# Patient Record
Sex: Female | Born: 1970 | Race: White | Hispanic: No | Smoking: Current every day smoker
Health system: Southern US, Community
[De-identification: ages and names within clinical notes are randomized; demographics above are authoritative.]

## PROBLEM LIST (undated history)

## (undated) DIAGNOSIS — F32A Depression, unspecified: Secondary | ICD-10-CM

## (undated) DIAGNOSIS — E079 Disorder of thyroid, unspecified: Secondary | ICD-10-CM

## (undated) DIAGNOSIS — J449 Chronic obstructive pulmonary disease, unspecified: Secondary | ICD-10-CM

## (undated) DIAGNOSIS — N028 Recurrent and persistent hematuria with other morphologic changes: Secondary | ICD-10-CM

## (undated) DIAGNOSIS — K769 Liver disease, unspecified: Secondary | ICD-10-CM

## (undated) DIAGNOSIS — N02B9 Other recurrent and persistent immunoglobulin A nephropathy: Secondary | ICD-10-CM

## (undated) DIAGNOSIS — N611 Abscess of the breast and nipple: Secondary | ICD-10-CM

## (undated) DIAGNOSIS — F319 Bipolar disorder, unspecified: Secondary | ICD-10-CM

## (undated) DIAGNOSIS — K219 Gastro-esophageal reflux disease without esophagitis: Secondary | ICD-10-CM

## (undated) DIAGNOSIS — E875 Hyperkalemia: Secondary | ICD-10-CM

## (undated) DIAGNOSIS — F419 Anxiety disorder, unspecified: Secondary | ICD-10-CM

## (undated) DIAGNOSIS — F1910 Other psychoactive substance abuse, uncomplicated: Secondary | ICD-10-CM

## (undated) HISTORY — DX: Bipolar disorder, unspecified: F31.9

## (undated) HISTORY — DX: Disorder of thyroid, unspecified: E07.9

## (undated) HISTORY — DX: Hyperkalemia: E87.5

## (undated) HISTORY — PX: BREAST EXCISIONAL BIOPSY: SUR124

## (undated) HISTORY — DX: Abscess of the breast and nipple: N61.1

## (undated) HISTORY — DX: Chronic obstructive pulmonary disease, unspecified: J44.9

## (undated) HISTORY — DX: Liver disease, unspecified: K76.9

## (undated) HISTORY — DX: Other psychoactive substance abuse, uncomplicated: F19.10

## (undated) HISTORY — DX: Gastro-esophageal reflux disease without esophagitis: K21.9

---
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
## 1998-01-30 ENCOUNTER — Other Ambulatory Visit: Admission: RE | Admit: 1998-01-30 | Discharge: 1998-01-30 | Payer: Self-pay | Admitting: Gynecology

## 1999-02-19 ENCOUNTER — Other Ambulatory Visit: Admission: RE | Admit: 1999-02-19 | Discharge: 1999-02-19 | Payer: Self-pay | Admitting: Gynecology

## 1999-04-01 ENCOUNTER — Other Ambulatory Visit: Admission: RE | Admit: 1999-04-01 | Discharge: 1999-04-01 | Payer: Self-pay | Admitting: Gynecology

## 1999-11-10 ENCOUNTER — Other Ambulatory Visit: Admission: RE | Admit: 1999-11-10 | Discharge: 1999-11-10 | Payer: Self-pay | Admitting: Gynecology

## 1999-12-13 ENCOUNTER — Emergency Department (HOSPITAL_COMMUNITY): Admission: EM | Admit: 1999-12-13 | Discharge: 1999-12-13 | Payer: Self-pay

## 2000-08-12 ENCOUNTER — Other Ambulatory Visit: Admission: RE | Admit: 2000-08-12 | Discharge: 2000-08-12 | Payer: Self-pay | Admitting: *Deleted

## 2000-12-08 ENCOUNTER — Encounter: Admission: RE | Admit: 2000-12-08 | Discharge: 2000-12-08 | Payer: Self-pay | Admitting: Family Medicine

## 2000-12-08 ENCOUNTER — Encounter: Payer: Self-pay | Admitting: Family Medicine

## 2000-12-14 ENCOUNTER — Encounter: Payer: Self-pay | Admitting: Family Medicine

## 2000-12-14 ENCOUNTER — Encounter: Admission: RE | Admit: 2000-12-14 | Discharge: 2000-12-14 | Payer: Self-pay | Admitting: Family Medicine

## 2001-02-15 ENCOUNTER — Emergency Department (HOSPITAL_COMMUNITY): Admission: EM | Admit: 2001-02-15 | Discharge: 2001-02-15 | Payer: Self-pay | Admitting: *Deleted

## 2001-06-22 ENCOUNTER — Encounter: Admission: RE | Admit: 2001-06-22 | Discharge: 2001-06-22 | Payer: Self-pay | Admitting: Internal Medicine

## 2001-06-22 ENCOUNTER — Encounter: Payer: Self-pay | Admitting: Internal Medicine

## 2002-05-29 ENCOUNTER — Encounter: Payer: Self-pay | Admitting: Internal Medicine

## 2002-05-29 ENCOUNTER — Encounter: Admission: RE | Admit: 2002-05-29 | Discharge: 2002-05-29 | Payer: Self-pay | Admitting: Internal Medicine

## 2002-06-04 ENCOUNTER — Encounter: Admission: RE | Admit: 2002-06-04 | Discharge: 2002-06-04 | Payer: Self-pay | Admitting: Internal Medicine

## 2002-06-04 ENCOUNTER — Encounter: Payer: Self-pay | Admitting: Internal Medicine

## 2002-10-11 ENCOUNTER — Emergency Department (HOSPITAL_COMMUNITY): Admission: EM | Admit: 2002-10-11 | Discharge: 2002-10-11 | Payer: Self-pay | Admitting: Emergency Medicine

## 2002-10-23 ENCOUNTER — Inpatient Hospital Stay (HOSPITAL_COMMUNITY): Admission: EM | Admit: 2002-10-23 | Discharge: 2002-10-24 | Payer: Self-pay | Admitting: Emergency Medicine

## 2004-07-07 ENCOUNTER — Other Ambulatory Visit: Admission: RE | Admit: 2004-07-07 | Discharge: 2004-07-07 | Payer: Self-pay | Admitting: Internal Medicine

## 2005-05-21 ENCOUNTER — Encounter: Admission: RE | Admit: 2005-05-21 | Discharge: 2005-05-21 | Payer: Self-pay | Admitting: Internal Medicine

## 2005-08-25 ENCOUNTER — Other Ambulatory Visit: Admission: RE | Admit: 2005-08-25 | Discharge: 2005-08-25 | Payer: Self-pay | Admitting: Internal Medicine

## 2005-09-06 ENCOUNTER — Encounter: Admission: RE | Admit: 2005-09-06 | Discharge: 2005-09-06 | Payer: Self-pay | Admitting: Internal Medicine

## 2006-05-08 ENCOUNTER — Emergency Department (HOSPITAL_COMMUNITY): Admission: EM | Admit: 2006-05-08 | Discharge: 2006-05-08 | Payer: Self-pay | Admitting: Emergency Medicine

## 2006-08-22 ENCOUNTER — Other Ambulatory Visit: Admission: RE | Admit: 2006-08-22 | Discharge: 2006-08-22 | Payer: Self-pay | Admitting: Family Medicine

## 2007-09-19 ENCOUNTER — Other Ambulatory Visit: Admission: RE | Admit: 2007-09-19 | Discharge: 2007-09-19 | Payer: Self-pay | Admitting: Family Medicine

## 2008-10-23 ENCOUNTER — Other Ambulatory Visit: Admission: RE | Admit: 2008-10-23 | Discharge: 2008-10-23 | Payer: Self-pay | Admitting: Family Medicine

## 2009-09-17 ENCOUNTER — Encounter: Admission: RE | Admit: 2009-09-17 | Discharge: 2009-09-17 | Payer: Self-pay | Admitting: Family Medicine

## 2010-06-15 ENCOUNTER — Encounter: Admission: RE | Admit: 2010-06-15 | Discharge: 2010-06-15 | Payer: Self-pay | Admitting: Family Medicine

## 2011-03-26 NOTE — Discharge Summary (Signed)
NAME:  Heidi Williamson, Heidi Williamson                       ACCOUNT NO.:  [id number]   MEDICAL RECORD NO.:  [id number]                   PATIENT TYPE:  INP   LOCATION:  0373                                 FACILITY:  [facility]   PHYSICIAN:  Sherin Quarry, MD                   DATE OF BIRTH:  06-09-1971   DATE OF ADMISSION:  10/22/2002  DATE OF DISCHARGE:  10/24/2002                                 DISCHARGE SUMMARY   HISTORY OF PRESENT ILLNESS:  The patient is a 40 year old lady with a  history of depression who presented to the [facility] emergency room on  October 22, 2002 reporting that she had taken a large number of Seroquel  tablets.  She stated that she did this in order to attempt to commit  suicide.  The patient's history was of uncertain accuracy because subsequent  to her admission to the hospital she showed no signs of any sedation or  lethargy, which would have been anticipated from taking a large number of  Seroquel tablets.  The patient was admitted to the hospital per Dr. Soyla Dryer.   PHYSICAL EXAMINATION AT THE TIME OF ADMISSION:  HEENT:  Within normal  limits.  CHEST:  Remarkable for rhonchi at the right base with mild expiratory  wheezing consistent with a history of heavy cigarette smoking.  CARDIOVASCULAR:  Revealed normal S1 and S2 without rubs, murmurs, or  gallops.  ABDOMEN:  Benign.  EXTREMITIES:  Examination revealed no evidence of cyanosis or edema.   LABORATORY DATA:  An electrocardiogram was obtained which showed normal  sinus rhythm without evidence of ischemia or hypertrophy.   Laboratory studies obtained showed sodium 138, potassium 3.5, glucose 94.  Renal function and liver studies were normal.  CBC was within normal limits.  Tricyclic screen was positive.  Drug screen was positive for barbiturates,  benzodiazepines, THC.  Urine pregnancy test was negative.  Blood alcohol  level was negative.  Salicylate level was negative.   HOSPITAL COURSE:  On admission the  patient was placed under suicide  precautions.  She was maintained on her usual medications which she reported  were Lexapro 10 mg b.i.d. and Seroquel 25 mg t.i.d.  I am not certain  whether this information is accurate.  In light of the finding of some chest  congestion, the patient was also placed on Tequin 400 mg daily for presumed  bronchitis.  ACT team consult was obtained and the patient will be  transferred to a state psychiatric facility for further treatment.  Therefore, on October 24, 2002 the patient was discharged.   DISCHARGE DIAGNOSES:  1. Possible Seroquel overdose with no apparent residual effects.  2. Chronic bronchitis secondary to cigarette smoking.  3.     Chronic depression.  4. 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   DISPOSITION:  Subsequent treatment will be per the psychiatrist at the state  facility.  Sherin Quarry, MD    SY/MEDQ  D:  10/24/2002  T:  10/24/2002  Job:  [id number]   cc:   Milagros Evener, M.D.  [address] [id number]  [place], [place] [address]  Fax: [phone]

## 2011-09-27 ENCOUNTER — Encounter (INDEPENDENT_AMBULATORY_CARE_PROVIDER_SITE_OTHER): Payer: Self-pay | Admitting: Surgery

## 2011-10-04 ENCOUNTER — Encounter (HOSPITAL_COMMUNITY): Payer: Self-pay | Admitting: Pharmacy Technician

## 2011-10-04 ENCOUNTER — Ambulatory Visit (INDEPENDENT_AMBULATORY_CARE_PROVIDER_SITE_OTHER): Payer: Self-pay | Admitting: Surgery

## 2011-10-04 ENCOUNTER — Encounter (INDEPENDENT_AMBULATORY_CARE_PROVIDER_SITE_OTHER): Payer: Self-pay | Admitting: Surgery

## 2011-10-04 VITALS — BP 100/60 | HR 77 | Temp 96.8°F | Resp 16 | Ht 66.0 in | Wt 160.2 lb

## 2011-10-04 DIAGNOSIS — N61 Mastitis without abscess: Secondary | ICD-10-CM

## 2011-10-04 DIAGNOSIS — N611 Abscess of the breast and nipple: Secondary | ICD-10-CM | POA: Insufficient documentation

## 2011-10-04 NOTE — Progress Notes (Signed)
Patient ID: Heidi Williamson, female   DOB: 01/26/1971, 40 y.o.   MRN: [id number]  Chief Complaint  Patient presents with  . Breast Pain    recurrent abscess    HPI Heidi Williamson is a 40 y.o. female.   HPI This is a pleasant female referred by Dr. Dr. Robert Fried for evaluation of a chronic left breast abscess. She has had intermittent abscess in her left breast for approximately 2 years. It has never had to have an incision and drainage performed. It always improves with antibiotics. She has had no spontaneous nipple discharge. She has had no previous breast surgery. She is otherwise without complaints Past Medical History  Diagnosis Date  . GERD (gastroesophageal reflux disease)   . Hyperpotassemia   . Bipolar 1 disorder   . Breast abscess     Past Surgical History  Procedure Date  . Wisdom tooth extraction 1988    History reviewed. No pertinent family history.  Social History History  Substance Use Topics  . Smoking status: Current Everyday Smoker -- 1.5 packs/day    Types: Cigarettes  . Smokeless tobacco: Not on file  . Alcohol Use: Not on file    No Known Allergies  Current Outpatient Prescriptions  Medication Sig Dispense Refill  . citalopram (CELEXA) 20 MG tablet Take 20 mg by mouth daily.        . Multiple Vitamin (MULTIVITAMIN) capsule Take 1 capsule by mouth daily.        . topiramate (TOPAMAX) 50 MG tablet Take 50 mg by mouth 2 (two) times daily.          Review of Systems Review of Systems  Constitutional: Negative for fever, chills and unexpected weight change.  HENT: Negative for hearing loss, congestion, sore throat, trouble swallowing and voice change.   Eyes: Negative for visual disturbance.  Respiratory: Negative for cough and wheezing.   Cardiovascular: Negative for chest pain, palpitations and leg swelling.  Gastrointestinal: Negative for nausea, vomiting, abdominal pain, diarrhea, constipation, blood in stool, abdominal distention and anal  bleeding.  Genitourinary: Negative for hematuria, vaginal bleeding and difficulty urinating.  Musculoskeletal: Negative for arthralgias.  Skin: Negative for rash and wound.  Neurological: Negative for seizures, syncope and headaches.  Hematological: Negative for adenopathy. Does not bruise/bleed easily.  Psychiatric/Behavioral: Negative for confusion.    Blood pressure 100/60, pulse 77, temperature 96.8 F (36 C), temperature source Temporal, resp. rate 16, height 5' 6" (1.676 m), weight 160 lb 3.2 oz (72.666 kg).  Physical Exam Physical Exam  Constitutional: She is oriented to person, place, and time. She appears well-developed and well-nourished. No distress.  HENT:  Head: Normocephalic.  Right Ear: External ear normal.  Left Ear: External ear normal.  Nose: Nose normal.  Mouth/Throat: Oropharynx is clear and moist. No oropharyngeal exudate.  Eyes: Conjunctivae are normal. Pupils are equal, round, and reactive to light. No scleral icterus.  Neck: Normal range of motion. Neck supple. No tracheal deviation present. No thyromegaly present.  Cardiovascular: Normal rate, regular rhythm, normal heart sounds and intact distal pulses.   No murmur heard. Pulmonary/Chest: Effort normal and breath sounds normal. No respiratory distress. She has no wheezes. She has no rales.  Musculoskeletal: Normal range of motion. She exhibits no edema.  Lymphadenopathy:    She has no cervical adenopathy.  Neurological: She is alert and oriented to person, place, and time. No cranial nerve deficit. Coordination normal.  Skin: Skin is warm and dry. No erythema.  Psychiatric: Her behavior   is normal. Judgment normal.  On breast exam, she has a slightly raised area at the 9:00 position of the left breast adjacent to the areola. There is very mild erythema. This area is nontender today. This appears consistent with a chronic abscess  Data Reviewed I have reviewed the notes from her primary care physician's  office. I have her mammogram which is unremarkable. There is no evidence of malignancy. An ultrasound was also unremarkable.  Assessment    Patient with a chronic left breast abscess    Plan    I discussed this with the patient in detail. Surgical excision of this area is recommended to hopefully resolve the abscess as well as rule out malignancy. I discussed the risk of surgery which includes but is not limited to bleeding and infection. I also discussed her smoking with her and encouraged her to quit. Surgery will be scheduled. Likelihood of success is good         Arye Weyenberg A 10/04/2011, 9:44 AM    

## 2011-10-05 ENCOUNTER — Encounter (HOSPITAL_COMMUNITY): Payer: Self-pay | Admitting: *Deleted

## 2011-10-05 DIAGNOSIS — K219 Gastro-esophageal reflux disease without esophagitis: Secondary | ICD-10-CM

## 2011-10-05 HISTORY — DX: Gastro-esophageal reflux disease without esophagitis: K21.9

## 2011-10-05 HISTORY — PX: CERVICAL CONE BIOPSY: SUR198

## 2011-10-06 ENCOUNTER — Encounter (HOSPITAL_COMMUNITY): Payer: Self-pay | Admitting: *Deleted

## 2011-10-06 ENCOUNTER — Ambulatory Visit (HOSPITAL_COMMUNITY)
Admission: RE | Admit: 2011-10-06 | Discharge: 2011-10-06 | Disposition: A | Discharge status: Home / Self Care | Payer: Self-pay | Source: Ambulatory Visit | Attending: Surgery | Admitting: Surgery

## 2011-10-06 ENCOUNTER — Encounter (HOSPITAL_COMMUNITY): Payer: Self-pay | Admitting: Anesthesiology

## 2011-10-06 ENCOUNTER — Ambulatory Visit (HOSPITAL_COMMUNITY): Payer: Self-pay | Admitting: Anesthesiology

## 2011-10-06 ENCOUNTER — Other Ambulatory Visit (INDEPENDENT_AMBULATORY_CARE_PROVIDER_SITE_OTHER): Payer: Self-pay | Admitting: Surgery

## 2011-10-06 ENCOUNTER — Encounter (HOSPITAL_COMMUNITY)
Admission: RE | Disposition: A | Discharge status: Home / Self Care | Payer: Self-pay | Source: Ambulatory Visit | Attending: Surgery

## 2011-10-06 DIAGNOSIS — K219 Gastro-esophageal reflux disease without esophagitis: Secondary | ICD-10-CM | POA: Insufficient documentation

## 2011-10-06 DIAGNOSIS — Z79899 Other long term (current) drug therapy: Secondary | ICD-10-CM | POA: Insufficient documentation

## 2011-10-06 DIAGNOSIS — N61 Mastitis without abscess: Secondary | ICD-10-CM | POA: Insufficient documentation

## 2011-10-06 HISTORY — PX: BREAST LUMPECTOMY: SHX2

## 2011-10-06 LAB — SURGICAL PCR SCREEN
MRSA, PCR: NEGATIVE
Staphylococcus aureus: NEGATIVE

## 2011-10-06 LAB — HCG, SERUM, QUALITATIVE: Preg, Serum: NEGATIVE

## 2011-10-06 SURGERY — BREAST LUMPECTOMY
Anesthesia: General | Site: Breast | Laterality: Left | Wound class: Contaminated

## 2011-10-06 MED ORDER — KETOROLAC TROMETHAMINE 30 MG/ML IJ SOLN
15.0000 mg | Freq: Once | INTRAMUSCULAR | Status: DC | PRN
Start: 2011-10-06 — End: 2011-10-06

## 2011-10-06 MED ORDER — BUPIVACAINE HCL (PF) 0.25 % IJ SOLN
INTRAMUSCULAR | Status: DC | PRN
  Administered 2011-10-06: 10 mL

## 2011-10-06 MED ORDER — SODIUM CHLORIDE 0.9 % IV SOLN: 250.0000 mL | INTRAVENOUS | Status: DC | PRN

## 2011-10-06 MED ORDER — BUPIVACAINE HCL (PF) 0.25 % IJ SOLN
INTRAMUSCULAR | Status: AC
  Filled 2011-10-06: qty 30

## 2011-10-06 MED ORDER — FENTANYL CITRATE 0.05 MG/ML IJ SOLN
50.0000 ug | INTRAMUSCULAR | Status: DC | PRN
  Administered 2011-10-06 (×2): 50 ug via INTRAVENOUS

## 2011-10-06 MED ORDER — PROPOFOL 10 MG/ML IV EMUL
INTRAVENOUS | Status: DC | PRN
  Administered 2011-10-06: 150 mL via INTRAVENOUS
  Administered 2011-10-06: 70 mL via INTRAVENOUS
  Administered 2011-10-06: 50 mL via INTRAVENOUS

## 2011-10-06 MED ORDER — ONDANSETRON HCL 4 MG/2ML IJ SOLN: 4.0000 mg | Freq: Four times a day (QID) | INTRAMUSCULAR | Status: DC | PRN

## 2011-10-06 MED ORDER — CEFAZOLIN SODIUM 1-5 GM-% IV SOLN
1.0000 g | INTRAVENOUS | Status: AC
  Administered 2011-10-06: 1 g via INTRAVENOUS

## 2011-10-06 MED ORDER — ACETAMINOPHEN 10 MG/ML IV SOLN
INTRAVENOUS | Status: DC | PRN
  Administered 2011-10-06: 1000 mg via INTRAVENOUS

## 2011-10-06 MED ORDER — MUPIROCIN 2 % EX OINT
TOPICAL_OINTMENT | CUTANEOUS | Status: AC
  Filled 2011-10-06: qty 22

## 2011-10-06 MED ORDER — ONDANSETRON HCL 4 MG/2ML IJ SOLN
INTRAMUSCULAR | Status: DC | PRN
  Administered 2011-10-06: 4 mg via INTRAVENOUS

## 2011-10-06 MED ORDER — FENTANYL CITRATE 0.05 MG/ML IJ SOLN
INTRAMUSCULAR | Status: AC
  Filled 2011-10-06: qty 2

## 2011-10-06 MED ORDER — FENTANYL CITRATE 0.05 MG/ML IJ SOLN
INTRAMUSCULAR | Status: DC | PRN
  Administered 2011-10-06: 50 ug via INTRAVENOUS
  Administered 2011-10-06 (×2): 100 ug via INTRAVENOUS

## 2011-10-06 MED ORDER — KETOROLAC TROMETHAMINE 60 MG/2ML IM SOLN
INTRAMUSCULAR | Status: DC | PRN
  Administered 2011-10-06: 30 mg via INTRAMUSCULAR

## 2011-10-06 MED ORDER — SODIUM CHLORIDE 0.9 % IJ SOLN: 3.0000 mL | Freq: Two times a day (BID) | INTRAMUSCULAR | Status: DC

## 2011-10-06 MED ORDER — ACETAMINOPHEN 325 MG PO TABS: 650.0000 mg | ORAL_TABLET | ORAL | Status: DC | PRN

## 2011-10-06 MED ORDER — CEPHALEXIN 500 MG PO CAPS: 500.0000 mg | ORAL_CAPSULE | Freq: Three times a day (TID) | ORAL | Status: AC

## 2011-10-06 MED ORDER — ACETAMINOPHEN 10 MG/ML IV SOLN
INTRAVENOUS | Status: AC
  Filled 2011-10-06: qty 100

## 2011-10-06 MED ORDER — MIDAZOLAM HCL 5 MG/5ML IJ SOLN
INTRAMUSCULAR | Status: DC | PRN
  Administered 2011-10-06: 2 mg via INTRAVENOUS

## 2011-10-06 MED ORDER — SODIUM CHLORIDE 0.9 % IJ SOLN: 3.0000 mL | INTRAMUSCULAR | Status: DC | PRN

## 2011-10-06 MED ORDER — BUPIVACAINE LIPOSOME 1.3 % IJ SUSP
20.0000 mL | Freq: Once | INTRAMUSCULAR | Status: AC
  Administered 2011-10-06: 17 mL
  Filled 2011-10-06: qty 20

## 2011-10-06 MED ORDER — CEFAZOLIN SODIUM 1-5 GM-% IV SOLN
INTRAVENOUS | Status: AC
  Filled 2011-10-06: qty 50

## 2011-10-06 MED ORDER — ACETAMINOPHEN 650 MG RE SUPP
650.0000 mg | RECTAL | Status: DC | PRN
  Filled 2011-10-06: qty 1

## 2011-10-06 MED ORDER — KETOROLAC TROMETHAMINE 10 MG PO TABS: 10.0000 mg | ORAL_TABLET | Freq: Four times a day (QID) | ORAL | Status: AC | PRN

## 2011-10-06 MED ORDER — PROMETHAZINE HCL 25 MG/ML IJ SOLN: 6.2500 mg | INTRAMUSCULAR | Status: DC | PRN

## 2011-10-06 MED ORDER — LACTATED RINGERS IV SOLN
INTRAVENOUS | Status: DC
  Administered 2011-10-06: 16:00:00 via INTRAVENOUS

## 2011-10-06 MED ORDER — ACETAMINOPHEN 10 MG/ML IV SOLN
1000.0000 mg | Freq: Once | INTRAVENOUS | Status: DC | PRN
  Filled 2011-10-06: qty 100

## 2011-10-06 MED ORDER — LACTATED RINGERS IV SOLN
INTRAVENOUS | Status: DC
  Administered 2011-10-06: 1000 mL via INTRAVENOUS

## 2011-10-06 SURGICAL SUPPLY — 30 items
APPLIER CLIP 9.375 MED OPEN (MISCELLANEOUS)
CANISTER SUCTION 2500CC (MISCELLANEOUS) ×2 IMPLANT
CHLORAPREP W/TINT 26ML (MISCELLANEOUS) ×2 IMPLANT
CLIP APPLIE 9.375 MED OPEN (MISCELLANEOUS) IMPLANT
CLOTH BEACON ORANGE TIMEOUT ST (SAFETY) ×2 IMPLANT
COVER PROBE W GEL 5X96 (DRAPES) IMPLANT
COVER SURGICAL LIGHT HANDLE (MISCELLANEOUS) ×2 IMPLANT
DECANTER SPIKE VIAL GLASS SM (MISCELLANEOUS) IMPLANT
DERMABOND ADVANCED (GAUZE/BANDAGES/DRESSINGS) ×1
DERMABOND ADVANCED .7 DNX12 (GAUZE/BANDAGES/DRESSINGS) ×1 IMPLANT
ELECT CAUTERY BLADE 6.4 (BLADE) ×2 IMPLANT
ELECT REM PT RETURN 9FT ADLT (ELECTROSURGICAL) ×2
ELECTRODE REM PT RTRN 9FT ADLT (ELECTROSURGICAL) ×1 IMPLANT
GLOVE SURG SIGNA 7.5 PF LTX (GLOVE) ×2 IMPLANT
GOWN PREVENTION PLUS XLARGE (GOWN DISPOSABLE) ×2 IMPLANT
GOWN STRL NON-REIN LRG LVL3 (GOWN DISPOSABLE) ×2 IMPLANT
KIT BASIN OR (CUSTOM PROCEDURE TRAY) ×2 IMPLANT
KIT MARKER MARGIN INK (KITS) ×2 IMPLANT
NS IRRIG 1000ML POUR BTL (IV SOLUTION) ×2 IMPLANT
PACK GENERAL/GYN (CUSTOM PROCEDURE TRAY) ×2 IMPLANT
SPONGE GAUZE 4X4 12PLY (GAUZE/BANDAGES/DRESSINGS) ×2 IMPLANT
SPONGE LAP 4X18 X RAY DECT (DISPOSABLE) ×2 IMPLANT
STAPLER VISISTAT 35W (STAPLE) IMPLANT
SUT MNCRL AB 4-0 PS2 18 (SUTURE) ×2 IMPLANT
SUT SILK 2 0 SH (SUTURE) IMPLANT
SUT VIC AB 3-0 SH 18 (SUTURE) ×2 IMPLANT
SYR CONTROL 10ML LL (SYRINGE) ×2 IMPLANT
TAPE PAPER 3X10 WHT MICROPORE (GAUZE/BANDAGES/DRESSINGS) ×2 IMPLANT
TOWEL OR 17X26 10 PK STRL BLUE (TOWEL DISPOSABLE) ×4 IMPLANT
WATER STERILE IRR 1000ML POUR (IV SOLUTION) IMPLANT

## 2011-10-06 NOTE — Anesthesia Postprocedure Evaluation (Signed)
  Anesthesia Post-op Note  Patient: Heidi Williamson  Procedure(s) Performed:  LUMPECTOMY - Excision of Left Breast Abscess  Patient Location: PACU  Anesthesia Type: General  Level of Consciousness: awake and alert   Airway and Oxygen Therapy: Patient Spontanous Breathing  Post-op Pain: mild  Post-op Assessment: Post-op Vital signs reviewed, Patient's Cardiovascular Status Stable, Respiratory Function Stable, Patent Airway and No signs of Nausea or vomiting  Post-op Vital Signs: stable  Complications: No apparent anesthesia complications

## 2011-10-06 NOTE — Interval H&P Note (Signed)
History and Physical Interval Note:  No change from two days ago when I saw her in the office   10/06/2011   7:30 AM   Heidi Williamson  has presented today for surgery, with the diagnosis of Chronic Left breast abscess  The various methods of treatment have been discussed with the patient and family. After consideration of risks, benefits and other options for treatment, the patient has consented to  Procedure(s): LUMPECTOMY as a surgical intervention .  The patients' history has been reviewed, patient examined, no change in status, stable for surgery.  I have reviewed the patients' chart and labs.  Questions were answered to the patient's satisfaction.     [name] A  MD

## 2011-10-06 NOTE — Transfer of Care (Signed)
Immediate Anesthesia Transfer of Care Note  Patient: Heidi Williamson  Procedure(s) Performed:  LUMPECTOMY - Excision of Left Breast Abscess  Patient Location: PACU  Anesthesia Type: General  Level of Consciousness: awake, alert , oriented and patient cooperative  Airway & Oxygen Therapy: Patient Spontanous Breathing and Patient connected to face mask oxygen  Post-op Assessment: Report given to PACU RN and Post -op Vital signs reviewed and stable  Post vital signs: Reviewed and stable  Complications: No apparent anesthesia complications

## 2011-10-06 NOTE — Anesthesia Preprocedure Evaluation (Addendum)
Anesthesia Evaluation  Patient identified by MRN, date of birth, ID band Patient awake    Reviewed: Allergy & Precautions, H&P , NPO status , Patient's Chart, lab work & pertinent test results  Airway Mallampati: II TM Distance: >3 FB Neck ROM: full    Dental No notable dental hx. (+) Dental Advidsory Given, Teeth Intact and Caps,    Pulmonary Current Smoker,  clear to auscultation  Pulmonary exam normal       Cardiovascular Exercise Tolerance: Good neg cardio ROS regular Normal    Neuro/Psych PSYCHIATRIC DISORDERS Bipolar Disorder Negative Neurological ROS     GI/Hepatic negative GI ROS, Neg liver ROS,   Endo/Other  Negative Endocrine ROS  Renal/GU negative Renal ROS  Genitourinary negative   Musculoskeletal   Abdominal   Peds  Hematology negative hematology ROS (+)   Anesthesia Other Findings   Reproductive/Obstetrics negative OB ROS                        Anesthesia Physical Anesthesia Plan  ASA: II  Anesthesia Plan: General   Post-op Pain Management:    Induction: Intravenous  Airway Management Planned: LMA  Additional Equipment:   Intra-op Plan:   Post-operative Plan:   Informed Consent: I have reviewed the patients History and Physical, chart, labs and discussed the procedure including the risks, benefits and alternatives for the proposed anesthesia with the patient or authorized representative who has indicated his/her understanding and acceptance.   Dental Advisory Given  Plan Discussed with: CRNA and Surgeon  Anesthesia Plan Comments:        Anesthesia Quick Evaluation

## 2011-10-06 NOTE — Op Note (Signed)
10/06/2011  2:09 PM  PATIENT:  Heidi Williamson  40 y.o. female  PRE-OPERATIVE DIAGNOSIS:  Chronic Left breast abscess  POST-OPERATIVE DIAGNOSIS:  Chronic Left breast abscess  PROCEDURE:  Procedure(s): EXCISION CHRONIC LEFT BREAST ABSCESS  SURGEON:  Surgeon(s): Shelly Rubenstein, MD  PHYSICIAN ASSISTANT:   ASSISTANTS: none   ANESTHESIA:   local and general  EBL:  Total I/O In: 800 [I.V.:800] Out: -   BLOOD ADMINISTERED:none  DRAINS: none   LOCAL MEDICATIONS USED:  BUPIVICAINE 20CC  SPECIMEN:  Excision  DISPOSITION OF SPECIMEN:  PATHOLOGY  COUNTS:  YES  TOURNIQUET:  * No tourniquets in log *  DICTATION: .Dragon Dictation The patient was brought to the operating room and identified as the correct patient. She was placed supine on the operating room table and general anesthesia was induced. Her left breast was then prepped and draped in the usual sterile fashion. I made a semicircular incision at the medial edge of the areola over the palpable mass. I did this down to the mass with a scalpel it became apparent that this was indeed a chronic abscess. I had to anesthetize the skin slightly with Marcaine. I then excised the chronic abscess circumferentially with electrocautery. It was decided to pathology for evaluation. I then irrigated with saline and achieved hemostasis with cautery. I then closed subcutaneous tissue with interrupted 3-0 nylon sutures. Gauze and tape were applied. The patient tolerated the procedure well. All counts were correct at the end of the procedure. The patient was then extubated in the operating room and taken in stable condition to the recovery room. PLAN OF CARE: Discharge to home after PACU  PATIENT DISPOSITION:  PACU - hemodynamically stable.   Delay start of Pharmacological VTE agent (>24hrs) due to surgical blood loss or risk of bleeding:  {YES/NO/NOT APPLICABLE:20182

## 2011-10-06 NOTE — H&P (View-Only) (Signed)
Patient ID: Heidi Williamson, female   DOB: 02/17/1971, 41 y.o.   MRN: [id number]  Chief Complaint  Patient presents with  . Breast Pain    recurrent abscess    HPI Heidi Williamson is a 40 y.o. female.   HPI This is a pleasant female referred by Dr. Dr. Marinda Elk for evaluation of a chronic left breast abscess. She has had intermittent abscess in her left breast for approximately 2 years. It has never had to have an incision and drainage performed. It always improves with antibiotics. She has had no spontaneous nipple discharge. She has had no previous breast surgery. She is otherwise without complaints Past Medical History  Diagnosis Date  . GERD (gastroesophageal reflux disease)   . Hyperpotassemia   . Bipolar 1 disorder   . Breast abscess     Past Surgical History  Procedure Date  . Wisdom tooth extraction 1988    History reviewed. No pertinent family history.  Social History History  Substance Use Topics  . Smoking status: Current Everyday Smoker -- 1.5 packs/day    Types: Cigarettes  . Smokeless tobacco: Not on file  . Alcohol Use: Not on file    No Known Allergies  Current Outpatient Prescriptions  Medication Sig Dispense Refill  . citalopram (CELEXA) 20 MG tablet Take 20 mg by mouth daily.        . Multiple Vitamin (MULTIVITAMIN) capsule Take 1 capsule by mouth daily.        Marland Kitchen topiramate (TOPAMAX) 50 MG tablet Take 50 mg by mouth 2 (two) times daily.          Review of Systems Review of Systems  Constitutional: Negative for fever, chills and unexpected weight change.  HENT: Negative for hearing loss, congestion, sore throat, trouble swallowing and voice change.   Eyes: Negative for visual disturbance.  Respiratory: Negative for cough and wheezing.   Cardiovascular: Negative for chest pain, palpitations and leg swelling.  Gastrointestinal: Negative for nausea, vomiting, abdominal pain, diarrhea, constipation, blood in stool, abdominal distention and anal  bleeding.  Genitourinary: Negative for hematuria, vaginal bleeding and difficulty urinating.  Musculoskeletal: Negative for arthralgias.  Skin: Negative for rash and wound.  Neurological: Negative for seizures, syncope and headaches.  Hematological: Negative for adenopathy. Does not bruise/bleed easily.  Psychiatric/Behavioral: Negative for confusion.    Blood pressure 100/60, pulse 77, temperature 96.8 F (36 C), temperature source Temporal, resp. rate 16, height 5\' 6"  (1.676 m), weight 160 lb 3.2 oz (72.666 kg).  Physical Exam Physical Exam  Constitutional: She is oriented to person, place, and time. She appears well-developed and well-nourished. No distress.  HENT:  Head: Normocephalic.  Right Ear: External ear normal.  Left Ear: External ear normal.  Nose: Nose normal.  Mouth/Throat: Oropharynx is clear and moist. No oropharyngeal exudate.  Eyes: Conjunctivae are normal. Pupils are equal, round, and reactive to light. No scleral icterus.  Neck: Normal range of motion. Neck supple. No tracheal deviation present. No thyromegaly present.  Cardiovascular: Normal rate, regular rhythm, normal heart sounds and intact distal pulses.   No murmur heard. Pulmonary/Chest: Effort normal and breath sounds normal. No respiratory distress. She has no wheezes. She has no rales.  Musculoskeletal: Normal range of motion. She exhibits no edema.  Lymphadenopathy:    She has no cervical adenopathy.  Neurological: She is alert and oriented to person, place, and time. No cranial nerve deficit. Coordination normal.  Skin: Skin is warm and dry. No erythema.  Psychiatric: Her behavior  is normal. Judgment normal.  On breast exam, she has a slightly raised area at the 9:00 position of the left breast adjacent to the areola. There is very mild erythema. This area is nontender today. This appears consistent with a chronic abscess  Data Reviewed I have reviewed the notes from her primary care physician's  office. I have her mammogram which is unremarkable. There is no evidence of malignancy. An ultrasound was also unremarkable.  Assessment    Patient with a chronic left breast abscess    Plan    I discussed this with the patient in detail. Surgical excision of this area is recommended to hopefully resolve the abscess as well as rule out malignancy. I discussed the risk of surgery which includes but is not limited to bleeding and infection. I also discussed her smoking with her and encouraged her to quit. Surgery will be scheduled. Likelihood of success is good         Akeisha Lagerquist A 10/04/2011, 9:44 AM

## 2011-10-08 ENCOUNTER — Encounter (HOSPITAL_COMMUNITY): Payer: Self-pay | Admitting: Surgery

## 2011-10-18 ENCOUNTER — Encounter (INDEPENDENT_AMBULATORY_CARE_PROVIDER_SITE_OTHER): Payer: Self-pay | Admitting: Surgery

## 2011-10-18 ENCOUNTER — Ambulatory Visit (INDEPENDENT_AMBULATORY_CARE_PROVIDER_SITE_OTHER): Payer: Self-pay | Admitting: Surgery

## 2011-10-18 VITALS — BP 116/74 | HR 64 | Temp 97.1°F | Resp 16 | Ht 67.0 in | Wt 170.0 lb

## 2011-10-18 DIAGNOSIS — Z09 Encounter for follow-up examination after completed treatment for conditions other than malignant neoplasm: Secondary | ICD-10-CM

## 2011-10-18 NOTE — Progress Notes (Signed)
Subjective:     Patient ID: Heidi Williamson, female   DOB: Dec 08, 1970, 40 y.o.   MRN: [id number]  HPI She is here for her first postoperative visit status post excision of a chronic left breast abscess. She is doing well and has no complaints.  Review of Systems     Objective:   Physical Exam On exam, the incision is well healed. I removed the sutures and placed Steri-Strips. There is no erythema.   The final pathology showed no evidence of malignancy and only chronic abscess formation Assessment:     Patient status post excision of chronic left breast abscess    Plan:     Hopefully this will resolve the issue. I will see her back as needed

## 2013-06-25 ENCOUNTER — Emergency Department (HOSPITAL_COMMUNITY)
Admission: EM | Admit: 2013-06-25 | Discharge: 2013-06-25 | Disposition: A | Discharge status: Home / Self Care | Payer: Self-pay | Attending: Emergency Medicine | Admitting: Emergency Medicine

## 2013-06-25 ENCOUNTER — Encounter (HOSPITAL_COMMUNITY): Payer: Self-pay | Admitting: Emergency Medicine

## 2013-06-25 DIAGNOSIS — F17200 Nicotine dependence, unspecified, uncomplicated: Secondary | ICD-10-CM | POA: Insufficient documentation

## 2013-06-25 DIAGNOSIS — R209 Unspecified disturbances of skin sensation: Secondary | ICD-10-CM | POA: Insufficient documentation

## 2013-06-25 DIAGNOSIS — Z862 Personal history of diseases of the blood and blood-forming organs and certain disorders involving the immune mechanism: Secondary | ICD-10-CM | POA: Insufficient documentation

## 2013-06-25 DIAGNOSIS — Z8719 Personal history of other diseases of the digestive system: Secondary | ICD-10-CM | POA: Insufficient documentation

## 2013-06-25 DIAGNOSIS — L989 Disorder of the skin and subcutaneous tissue, unspecified: Secondary | ICD-10-CM | POA: Insufficient documentation

## 2013-06-25 DIAGNOSIS — Z79899 Other long term (current) drug therapy: Secondary | ICD-10-CM | POA: Insufficient documentation

## 2013-06-25 DIAGNOSIS — Z8639 Personal history of other endocrine, nutritional and metabolic disease: Secondary | ICD-10-CM | POA: Insufficient documentation

## 2013-06-25 DIAGNOSIS — Z872 Personal history of diseases of the skin and subcutaneous tissue: Secondary | ICD-10-CM | POA: Insufficient documentation

## 2013-06-25 DIAGNOSIS — F319 Bipolar disorder, unspecified: Secondary | ICD-10-CM | POA: Insufficient documentation

## 2013-06-25 MED ORDER — OXYCODONE-ACETAMINOPHEN 5-325 MG PO TABS: 1.0000 | ORAL_TABLET | Freq: Four times a day (QID) | ORAL | Status: DC | PRN

## 2013-06-25 MED ORDER — OXYCODONE-ACETAMINOPHEN 5-325 MG PO TABS
2.0000 | ORAL_TABLET | Freq: Once | ORAL | Status: AC
  Administered 2013-06-25: 2 via ORAL
  Filled 2013-06-25: qty 2

## 2013-06-25 NOTE — ED Provider Notes (Signed)
     Joya Gaskins, MD 06/25/13 [phone]

## 2013-06-25 NOTE — ED Provider Notes (Signed)
CSN: [id number]     Arrival date & time 06/25/13  2104 History  This chart was scribed for non-physician practitioner Magnus Sinning, PA-C working with Joya Gaskins, MD by Shari Heritage, ED Scribe. This patient was seen in room TR06C/TR06C and the patient's care was started at 10:29 PM.    Chief Complaint  Patient presents with  . Finger Injury    The history is provided by the patient. No language interpreter was used.   HPI Comments: Heidi Williamson is a 42 y.o. female who presents to the Emergency Department complaining of distal left 5th finger pain with associated swelling that began in July, but has worsened over the past 3 days. Patient also reports numbness and paresthesias in the left 5th finger. She was evaluated for this issue at [facility] and symptoms were attributed to electrolyte imbalance and patient was treated with magnesium and potassium. When symptoms worsened, patient saw her PCP, Carilyn Goodpasture, who prescribed Augmentin to treat a felon. Patient reports that she has been taking the Augmentin as prescribed.  Patient denies fever or chills. No drainage from the area.  She has full ROM of her finger.  She has a medical history of GERD and bipolar 1 disorder. She is a current every day smoker.    Past Medical History  Diagnosis Date  . Hyperpotassemia   . Bipolar 1 disorder   . Breast abscess   . GERD (gastroesophageal reflux disease) 10-05-11    not a problem   Past Surgical History  Procedure Laterality Date  . Wisdom tooth extraction  1988  . Cervical cone biopsy  10-05-11    ?1996  . Breast lumpectomy  10/06/2011    Procedure: LUMPECTOMY;  Surgeon: Shelly Rubenstein, MD;  Location: [facility] ORS;  Service: General;  Laterality: Left;  Excision of Left Breast Abscess   No family history on file. History  Substance Use Topics  . Smoking status: Current Every Day Smoker -- 1.50 packs/day for 15 years    Types: Cigarettes  . Smokeless tobacco:  Never Used  . Alcohol Use: Not on file     Comment: 12 pk/ weeks/ Pt. aware no alcohol or marijuana use x24 hours of surgery   OB History   Grav Para Term Preterm Abortions TAB SAB Ect Mult Living                 Review of Systems A complete 10 system review of systems was obtained and all systems are negative except as noted in the HPI and PMH.   Allergies  Tape  Home Medications   Current Outpatient Rx  Name  Route  Sig  Dispense  Refill  . citalopram (CELEXA) 20 MG tablet   Oral   Take 20 mg by mouth at bedtime.          . Multiple Vitamins-Minerals (MULTIVITAMINS THER. W/MINERALS) TABS   Oral   Take 1 tablet by mouth daily.          . naphazoline (CLEAR EYES) 0.012 % ophthalmic solution   Both Eyes   Place 1 drop into both eyes 4 (four) times daily as needed. For dry/irritated eyes         . topiramate (TOPAMAX) 50 MG tablet   Oral   Take 50 mg by mouth at bedtime.           Triage Vitals: BP 125/58  Pulse 92  Temp(Src) 98.3 F (36.8 C) (Oral)  Resp 16  SpO2 99%  LMP 06/15/2013  Physical Exam  Nursing note and vitals reviewed. Constitutional: She is oriented to person, place, and time. She appears well-developed and well-nourished.  HENT:  Head: Normocephalic and atraumatic.  Eyes: EOM are normal.  Neck: Neck supple.  Cardiovascular: Normal rate, regular rhythm and normal heart sounds.   Pulmonary/Chest: Effort normal and breath sounds normal.  Neurological: She is alert and oriented to person, place, and time.  Sensation of the left 5th digit intact  Skin: Skin is warm and dry.  Two brownish colored lesions of the distal finger pad of the left 5th digit.  No surrounding erythema or edema.  No drainage.  No fluctuance.  See image below.  Good capillary refill of the left 5th digit.    Psychiatric: She has a normal mood and affect.    ED Course  DIAGNOSTIC STUDIES: Oxygen Saturation is 99% on room air, normal by my interpretation.     COORDINATION OF CARE: 10:36 PM- Patient informed of current plan for treatment and evaluation and agrees with plan at this time.   Procedures (including critical care time)  Labs Reviewed - No data to display No results found. No diagnosis found.  Patient discussed with and also evaluated by Dr. Bebe Shaggy.  10:55 PM Discussed with Dr. Mina Marble with Hand Surgery.  He looked at the images on Epic.  He reports that he can follow up with the patient in the next couple of days.  MDM  Patient presenting with two brownish colored lesions of her left 5th digit.  She is neurovascularly intact.  No felon or drainable abscess at this time.  Hand surgery has been consulted and the patient can follow up in the next day or two.  Patient stable for discharge.  Patient discharged home with pain medication.  I personally performed the services described in this documentation, which was scribed in my presence. The recorded information has been reviewed and is accurate.   Pascal Lux [place], PA-C 06/26/13 [phone]

## 2013-06-25 NOTE — ED Notes (Signed)
PT. REPORTS PROGRESSING LEFT 5TH DISTAL FINGER INFECTION WITH SWELLING / DRAINAGE / PAIN FOR 1 WEEK , CURRENTLY TAKING AUGMENTIN ORAL ANTIBIOTIC WITH NO IMPROVEMENT.

## 2013-06-25 NOTE — ED Notes (Signed)
Called patient, no answer 

## 2013-06-27 NOTE — ED Provider Notes (Signed)
Medical screening examination/treatment/procedure(s) were conducted as a shared visit with non-physician practitioner(s) and myself.  I personally evaluated the patient during the encounter  Images documented (Patient gave verbal permission to utilize photo for medical documentation only The image was not stored on any personal device)  Pt has been experiencing issues since last month.  Currently she is stable for outpatient management.  Explained to patient that at times the emergency department can not always provide definitive treatment but can help assure close specialist followup.  Hand followup has been arranged.  I advised patient to continue taking her antibiotics.    Joya Gaskins, MD 06/27/13 1024

## 2013-08-01 ENCOUNTER — Emergency Department (HOSPITAL_COMMUNITY)
Admission: EM | Admit: 2013-08-01 | Discharge: 2013-08-02 | Disposition: A | Discharge status: Other Acute Inpatient Hospital | Payer: No Typology Code available for payment source | Attending: Emergency Medicine | Admitting: Emergency Medicine

## 2013-08-01 ENCOUNTER — Encounter (HOSPITAL_COMMUNITY): Payer: Self-pay | Admitting: Emergency Medicine

## 2013-08-01 DIAGNOSIS — F331 Major depressive disorder, recurrent, moderate: Secondary | ICD-10-CM | POA: Insufficient documentation

## 2013-08-01 DIAGNOSIS — Y929 Unspecified place or not applicable: Secondary | ICD-10-CM | POA: Insufficient documentation

## 2013-08-01 DIAGNOSIS — Z8742 Personal history of other diseases of the female genital tract: Secondary | ICD-10-CM | POA: Insufficient documentation

## 2013-08-01 DIAGNOSIS — E875 Hyperkalemia: Secondary | ICD-10-CM | POA: Insufficient documentation

## 2013-08-01 DIAGNOSIS — Z87898 Personal history of other specified conditions: Secondary | ICD-10-CM | POA: Insufficient documentation

## 2013-08-01 DIAGNOSIS — F17200 Nicotine dependence, unspecified, uncomplicated: Secondary | ICD-10-CM | POA: Insufficient documentation

## 2013-08-01 DIAGNOSIS — F319 Bipolar disorder, unspecified: Secondary | ICD-10-CM | POA: Insufficient documentation

## 2013-08-01 DIAGNOSIS — F1010 Alcohol abuse, uncomplicated: Secondary | ICD-10-CM | POA: Insufficient documentation

## 2013-08-01 DIAGNOSIS — R45851 Suicidal ideations: Secondary | ICD-10-CM | POA: Insufficient documentation

## 2013-08-01 DIAGNOSIS — L089 Local infection of the skin and subcutaneous tissue, unspecified: Secondary | ICD-10-CM | POA: Insufficient documentation

## 2013-08-01 DIAGNOSIS — Y9389 Activity, other specified: Secondary | ICD-10-CM | POA: Insufficient documentation

## 2013-08-01 DIAGNOSIS — K219 Gastro-esophageal reflux disease without esophagitis: Secondary | ICD-10-CM | POA: Insufficient documentation

## 2013-08-01 DIAGNOSIS — Z7982 Long term (current) use of aspirin: Secondary | ICD-10-CM | POA: Insufficient documentation

## 2013-08-01 DIAGNOSIS — Z79899 Other long term (current) drug therapy: Secondary | ICD-10-CM | POA: Insufficient documentation

## 2013-08-01 DIAGNOSIS — W1809XA Striking against other object with subsequent fall, initial encounter: Secondary | ICD-10-CM | POA: Insufficient documentation

## 2013-08-01 HISTORY — DX: Recurrent and persistent hematuria with other morphologic changes: N02.8

## 2013-08-01 HISTORY — DX: Other recurrent and persistent immunoglobulin A nephropathy: N02.B9

## 2013-08-01 LAB — COMPREHENSIVE METABOLIC PANEL
ALT: 15 U/L (ref 0–35)
AST: 16 U/L (ref 0–37)
Albumin: 3.6 g/dL (ref 3.5–5.2)
Alkaline Phosphatase: 62 U/L (ref 39–117)
BUN: 9 mg/dL (ref 6–23)
CO2: 21 mEq/L (ref 19–32)
Calcium: 8.6 mg/dL (ref 8.4–10.5)
Chloride: 102 mEq/L (ref 96–112)
Creatinine, Ser: 0.61 mg/dL (ref 0.50–1.10)
GFR calc Af Amer: >90 mL/min (ref >90)
GFR calc non Af Amer: >90 mL/min (ref >90)
Glucose, Bld: 104 mg/dL — ABNORMAL HIGH (ref 70–99)
Potassium: 4 mEq/L (ref 3.5–5.1)
Sodium: 133 mEq/L — ABNORMAL LOW (ref 135–145)
Total Bilirubin: 0.2 mg/dL — ABNORMAL LOW (ref 0.3–1.2)
Total Protein: 6.7 g/dL (ref 6.0–8.3)

## 2013-08-01 LAB — RAPID URINE DRUG SCREEN, HOSP PERFORMED
Amphetamines: NOT DETECTED
Barbiturates: NOT DETECTED
Benzodiazepines: NOT DETECTED
Cocaine: NOT DETECTED
Opiates: NOT DETECTED
Tetrahydrocannabinol: POSITIVE — AB

## 2013-08-01 LAB — CBC
HCT: 38.5 % (ref 36.0–46.0)
Hemoglobin: 13.5 g/dL (ref 12.0–15.0)
MCH: 34.7 pg — ABNORMAL HIGH (ref 26.0–34.0)
MCHC: 35.1 g/dL (ref 30.0–36.0)
MCV: 99 fL (ref 78.0–100.0)
Platelets: 277 10*3/uL (ref 150–400)
RBC: 3.89 MIL/uL (ref 3.87–5.11)
RDW: 12.1 % (ref 11.5–15.5)
WBC: 8.6 10*3/uL (ref 4.0–10.5)

## 2013-08-01 LAB — SALICYLATE LEVEL: Salicylate Lvl: <2 mg/dL — ABNORMAL LOW (ref 2.8–20.0)

## 2013-08-01 LAB — POCT PREGNANCY, URINE: Preg Test, Ur: NEGATIVE

## 2013-08-01 LAB — ETHANOL: Alcohol, Ethyl (B): <11 mg/dL (ref 0–11)

## 2013-08-01 LAB — ACETAMINOPHEN LEVEL: Acetaminophen (Tylenol), Serum: <15 ug/mL (ref 10–30)

## 2013-08-01 MED ORDER — LORAZEPAM 1 MG PO TABS: 1.0000 mg | ORAL_TABLET | Freq: Three times a day (TID) | ORAL | Status: DC | PRN

## 2013-08-01 MED ORDER — CITALOPRAM HYDROBROMIDE 20 MG PO TABS
20.0000 mg | ORAL_TABLET | Freq: Every day | ORAL | Status: DC
  Administered 2013-08-02: 20 mg via ORAL
  Filled 2013-08-01 (×3): qty 1

## 2013-08-01 MED ORDER — ZOLPIDEM TARTRATE 5 MG PO TABS: 5.0000 mg | ORAL_TABLET | Freq: Every evening | ORAL | Status: DC | PRN

## 2013-08-01 MED ORDER — NICOTINE 21 MG/24HR TD PT24
21.0000 mg | MEDICATED_PATCH | Freq: Every day | TRANSDERMAL | Status: DC
  Administered 2013-08-02: 21 mg via TRANSDERMAL
  Filled 2013-08-01: qty 1

## 2013-08-01 MED ORDER — CEPHALEXIN 500 MG PO CAPS
500.0000 mg | ORAL_CAPSULE | Freq: Two times a day (BID) | ORAL | Status: DC
  Administered 2013-08-02 (×2): 500 mg via ORAL
  Filled 2013-08-01 (×2): qty 1

## 2013-08-01 MED ORDER — ALUM & MAG HYDROXIDE-SIMETH 200-200-20 MG/5ML PO SUSP: 30.0000 mL | ORAL | Status: DC | PRN

## 2013-08-01 MED ORDER — IBUPROFEN 200 MG PO TABS
600.0000 mg | ORAL_TABLET | Freq: Three times a day (TID) | ORAL | Status: DC | PRN
  Administered 2013-08-02: 600 mg via ORAL
  Filled 2013-08-01 (×2): qty 3

## 2013-08-01 MED ORDER — ONDANSETRON HCL 4 MG PO TABS: 4.0000 mg | ORAL_TABLET | Freq: Three times a day (TID) | ORAL | Status: DC | PRN

## 2013-08-01 MED ORDER — POTASSIUM 99 MG PO TABS: 99.0000 mg | ORAL_TABLET | Freq: Every day | ORAL | Status: DC

## 2013-08-01 MED ORDER — PANTOPRAZOLE SODIUM 40 MG PO TBEC
40.0000 mg | DELAYED_RELEASE_TABLET | Freq: Every day | ORAL | Status: DC
  Administered 2013-08-02: 40 mg via ORAL
  Filled 2013-08-01: qty 1

## 2013-08-01 MED ORDER — CALCIUM CARBONATE-VITAMIN D 500-200 MG-UNIT PO TABS
1.0000 | ORAL_TABLET | Freq: Every day | ORAL | Status: DC
  Administered 2013-08-02: 09:00:00 1 via ORAL
  Filled 2013-08-01 (×2): qty 1

## 2013-08-01 MED ORDER — LURASIDONE HCL 40 MG PO TABS
20.0000 mg | ORAL_TABLET | Freq: Every day | ORAL | Status: DC
Start: 2013-08-02 — End: 2013-08-02
  Administered 2013-08-02: 20 mg via ORAL
  Filled 2013-08-01 (×2): qty 1

## 2013-08-01 NOTE — ED Provider Notes (Signed)
CSN: [id number]     Arrival date & time 08/01/13  2217 History   First MD Initiated Contact with Patient 08/01/13 2309     Chief Complaint  Patient presents with  . Medical Clearance   (Consider location/radiation/quality/duration/timing/severity/associated sxs/prior Treatment) HPI Comments: 42 year old female with a past medical history of bipolar disorder brought to the emergency department via [facility] from [facility] with complaints of worsening depression, suicidal ideations and alcohol abuse. She went to [facility] around 2:00 yesterday afternoon, however they will not keep her here because she has an open wound on her right lower leg. Patient states at the beginning of August she fell and hit her leg, the wound has not completely healed. Patient states it looks much better than it did at first, denies any increased redness. States she was on Augmentin back in August, however she is unsure if it was for the wound. Patient states she drinks at least a sixpack of beer daily, has thoughts of suicide on and off without a plan. Patient is under IVC.  The history is provided by the patient and medical records.    Past Medical History  Diagnosis Date  . Hyperpotassemia   . Bipolar 1 disorder   . Breast abscess   . GERD (gastroesophageal reflux disease) 10-05-11    not a problem  . Berger's disease    Past Surgical History  Procedure Laterality Date  . Wisdom tooth extraction  1988  . Cervical cone biopsy  10-05-11    ?1996  . Breast lumpectomy  10/06/2011    Procedure: LUMPECTOMY;  Surgeon: Shelly Rubenstein, MD;  Location: [facility];  Service: General;  Laterality: Left;  Excision of Left Breast Abscess   No family history on file. History  Substance Use Topics  . Smoking status: Current Every Day Smoker -- 1.50 packs/day for 15 years    Types: Cigarettes  . Smokeless tobacco: Never Used  . Alcohol Use: 1.2 oz/week    2 Cans of beer per week     Comment: 12 pk/ weeks/ Pt. aware no alcohol or  marijuana use x24 hours of surgery   OB History   Grav Para Term Preterm Abortions TAB SAB Ect Mult Living                 Review of Systems  Constitutional: Negative for fever and chills.  Musculoskeletal: Negative for gait problem.  Skin: Positive for wound.  Psychiatric/Behavioral: Positive for suicidal ideas and dysphoric mood.  All other systems reviewed and are negative.    Allergies  Tape  Home Medications   Current Outpatient Rx  Name  Route  Sig  Dispense  Refill  . aspirin EC 81 MG tablet   Oral   Take 81 mg by mouth daily.         . calcium-vitamin D (OSCAL WITH D) 500-200 MG-UNIT per tablet   Oral   Take 1 tablet by mouth daily with breakfast.         . citalopram (CELEXA) 20 MG tablet   Oral   Take 20 mg by mouth at bedtime.          Marland Kitchen ibuprofen (ADVIL,MOTRIN) 200 MG tablet   Oral   Take 200 mg by mouth 2 (two) times daily.         . Lurasidone HCl (LATUDA) 20 MG TABS   Oral   Take 20 mg by mouth daily.         . Magnesium 250 MG TABS  Oral   Take 500 mg by mouth daily.         . Multiple Vitamins-Minerals (MULTIVITAMINS THER. W/MINERALS) TABS   Oral   Take 1 tablet by mouth daily.          Marland Kitchen omeprazole (PRILOSEC) 20 MG capsule   Oral   Take 20 mg by mouth daily.         . Potassium 99 MG TABS   Oral   Take 99 mg by mouth daily.         Marland Kitchen topiramate (TOPAMAX) 100 MG tablet   Oral   Take 100 mg by mouth at bedtime.         Marland Kitchen zolpidem (AMBIEN) 5 MG tablet   Oral   Take 5 mg by mouth at bedtime as needed for sleep.         . naphazoline (CLEAR EYES) 0.012 % ophthalmic solution   Both Eyes   Place 1 drop into both eyes 4 (four) times daily as needed. For dry/irritated eyes         . naproxen sodium (ANAPROX) 220 MG tablet   Oral   Take 220 mg by mouth 2 (two) times daily with a meal.          BP 112/64  Pulse 52  Temp(Src) 98.1 F (36.7 C) (Oral)  Resp 16  Ht 5\' 6"  (1.676 m)  Wt 170 lb (77.111 kg)   BMI 27.45 kg/m2  SpO2 100%  LMP 07/10/2013 Physical Exam  Nursing note and vitals reviewed. Constitutional: She is oriented to person, place, and time. She appears well-developed and well-nourished. No distress.  HENT:  Head: Normocephalic and atraumatic.  Mouth/Throat: Oropharynx is clear and moist.  Eyes: Conjunctivae are normal.  Neck: Normal range of motion. Neck supple.  Cardiovascular: Normal rate, regular rhythm and normal heart sounds.   Pulmonary/Chest: Effort normal and breath sounds normal.  Abdominal: Soft. Bowel sounds are normal. There is no tenderness.  Musculoskeletal: Normal range of motion. She exhibits no edema.  Neurological: She is alert and oriented to person, place, and time.  Skin: Skin is warm and dry. She is not diaphoretic.     Psychiatric: She has a normal mood and affect. Her behavior is normal.    ED Course  Procedures (including critical care time) Labs Review Labs Reviewed  CBC - Abnormal; Notable for the following:    MCH 34.7 (*)    All other components within normal limits  ACETAMINOPHEN LEVEL  COMPREHENSIVE METABOLIC PANEL  ETHANOL  SALICYLATE LEVEL  URINE RAPID DRUG SCREEN (HOSP PERFORMED)  POCT PREGNANCY, URINE   Imaging Review No results found.  MDM  No diagnosis found.   Patient with alcohol abuse, depression, SI. [facility] would not accept patient due to open wound. Wound appears infected, no surrounding cellulitis. Will start PO Keflex as this wound has been present since the beginning of august. Psych labs pending. TTS consult. 11:53 PM Medically cleared to move to psych ED.  Trevor Mace, PA-C 08/03/13 0005

## 2013-08-01 NOTE — ED Notes (Signed)
Pt brought to ED by [facility] from [facility], pt unable to return to [facility] d/t open wounds. Pt is IVC, +SI

## 2013-08-02 ENCOUNTER — Inpatient Hospital Stay (HOSPITAL_COMMUNITY)
Admission: AD | Admit: 2013-08-02 | Discharge: 2013-08-07 | DRG: 885 | Disposition: A | Discharge status: Home / Self Care | Payer: No Typology Code available for payment source | Source: Intra-hospital | Attending: Psychiatry | Admitting: Psychiatry

## 2013-08-02 ENCOUNTER — Encounter (HOSPITAL_COMMUNITY): Payer: Self-pay | Admitting: *Deleted

## 2013-08-02 DIAGNOSIS — F319 Bipolar disorder, unspecified: Secondary | ICD-10-CM | POA: Diagnosis present

## 2013-08-02 DIAGNOSIS — Z79899 Other long term (current) drug therapy: Secondary | ICD-10-CM

## 2013-08-02 DIAGNOSIS — F331 Major depressive disorder, recurrent, moderate: Principal | ICD-10-CM

## 2013-08-02 DIAGNOSIS — Z5987 Material hardship due to limited financial resources, not elsewhere classified: Secondary | ICD-10-CM

## 2013-08-02 DIAGNOSIS — Z598 Other problems related to housing and economic circumstances: Secondary | ICD-10-CM

## 2013-08-02 DIAGNOSIS — F1010 Alcohol abuse, uncomplicated: Secondary | ICD-10-CM

## 2013-08-02 DIAGNOSIS — N059 Unspecified nephritic syndrome with unspecified morphologic changes: Secondary | ICD-10-CM | POA: Diagnosis present

## 2013-08-02 DIAGNOSIS — R45851 Suicidal ideations: Secondary | ICD-10-CM

## 2013-08-02 DIAGNOSIS — K219 Gastro-esophageal reflux disease without esophagitis: Secondary | ICD-10-CM | POA: Diagnosis present

## 2013-08-02 DIAGNOSIS — F39 Unspecified mood [affective] disorder: Secondary | ICD-10-CM

## 2013-08-02 DIAGNOSIS — F329 Major depressive disorder, single episode, unspecified: Secondary | ICD-10-CM

## 2013-08-02 MED ORDER — NICOTINE 21 MG/24HR TD PT24
21.0000 mg | MEDICATED_PATCH | Freq: Every day | TRANSDERMAL | Status: DC
  Administered 2013-08-03 – 2013-08-07 (×5): 21 mg via TRANSDERMAL
  Filled 2013-08-02 (×8): qty 1

## 2013-08-02 MED ORDER — CALCIUM CARBONATE-VITAMIN D 500-200 MG-UNIT PO TABS
1.0000 | ORAL_TABLET | Freq: Every day | ORAL | Status: DC
  Administered 2013-08-03 – 2013-08-07 (×5): 1 via ORAL
  Filled 2013-08-02 (×6): qty 1

## 2013-08-02 MED ORDER — LORAZEPAM 1 MG PO TABS
1.0000 mg | ORAL_TABLET | Freq: Three times a day (TID) | ORAL | Status: DC | PRN
  Administered 2013-08-03 – 2013-08-04 (×5): 1 mg via ORAL
  Filled 2013-08-02 (×5): qty 1

## 2013-08-02 MED ORDER — CEPHALEXIN 500 MG PO CAPS
500.0000 mg | ORAL_CAPSULE | Freq: Two times a day (BID) | ORAL | Status: DC
  Administered 2013-08-02 – 2013-08-07 (×10): 500 mg via ORAL
  Filled 2013-08-02 (×10): qty 1
  Filled 2013-08-02: qty 2
  Filled 2013-08-02 (×2): qty 1

## 2013-08-02 MED ORDER — ZOLPIDEM TARTRATE 5 MG PO TABS
5.0000 mg | ORAL_TABLET | Freq: Every evening | ORAL | Status: DC | PRN
  Administered 2013-08-02: 5 mg via ORAL
  Filled 2013-08-02: qty 1

## 2013-08-02 MED ORDER — PANTOPRAZOLE SODIUM 40 MG PO TBEC
40.0000 mg | DELAYED_RELEASE_TABLET | Freq: Every day | ORAL | Status: DC
  Administered 2013-08-02 – 2013-08-07 (×6): 40 mg via ORAL
  Filled 2013-08-02 (×8): qty 1

## 2013-08-02 MED ORDER — IBUPROFEN 600 MG PO TABS
600.0000 mg | ORAL_TABLET | Freq: Three times a day (TID) | ORAL | Status: DC | PRN
  Administered 2013-08-02 – 2013-08-07 (×11): 600 mg via ORAL
  Filled 2013-08-02 (×12): qty 1

## 2013-08-02 MED ORDER — MAGNESIUM HYDROXIDE 400 MG/5ML PO SUSP: 30.0000 mL | Freq: Every day | ORAL | Status: DC | PRN

## 2013-08-02 MED ORDER — LURASIDONE HCL 40 MG PO TABS
20.0000 mg | ORAL_TABLET | Freq: Every day | ORAL | Status: DC
  Administered 2013-08-03 – 2013-08-07 (×5): 20 mg via ORAL
  Filled 2013-08-02 (×7): qty 1

## 2013-08-02 MED ORDER — ONDANSETRON HCL 4 MG PO TABS: 4.0000 mg | ORAL_TABLET | Freq: Three times a day (TID) | ORAL | Status: DC | PRN

## 2013-08-02 MED ORDER — ACETAMINOPHEN 325 MG PO TABS
650.0000 mg | ORAL_TABLET | Freq: Four times a day (QID) | ORAL | Status: DC | PRN
  Administered 2013-08-05: 650 mg via ORAL
  Filled 2013-08-02: qty 2

## 2013-08-02 MED ORDER — CITALOPRAM HYDROBROMIDE 20 MG PO TABS
20.0000 mg | ORAL_TABLET | Freq: Every day | ORAL | Status: DC
  Administered 2013-08-02: 20 mg via ORAL
  Filled 2013-08-02 (×2): qty 1

## 2013-08-02 MED ORDER — ALUM & MAG HYDROXIDE-SIMETH 200-200-20 MG/5ML PO SUSP
30.0000 mL | ORAL | Status: DC | PRN
  Administered 2013-08-05: 30 mL via ORAL

## 2013-08-02 NOTE — Progress Notes (Signed)
D.  Pt. Guarded and anxious..  Denies SI/HI and denies A/V hallucinations.  Pt. Attended group.   A.  Encouraged pt. To discuss feelings.  Q 15 minute checks for safety. R.  Pt. Remains safe and receptive to encouragement.

## 2013-08-02 NOTE — Consult Note (Signed)
  Psychiatric Specialty Exam: @PHYSEXAMBYAGE2 @  @ROS @  Blood pressure 112/71, pulse 68, temperature 97.8 F (36.6 C), temperature source Oral, resp. rate 16, height 5\' 6"  (1.676 m), weight 77.111 kg (170 lb), last menstrual period 07/10/2013, SpO2 99.00%.Body mass index is 27.45 kg/(m^2).  General Appearance: Well Groomed  Patent attorney::  Good  Speech:  Clear and Coherent and Normal Rate  Volume:  Normal  Mood:  Anxious and Depressed  Affect:  Appropriate  Thought Process:  Negative  Orientation:  Full (Time, Place, and Person)  Thought Content:  Negative  Suicidal Thoughts:  Yes.  without intent/plan  Homicidal Thoughts:  No  Memory:  Immediate;   Good Recent;   Good Remote;   Good  Judgement:  Good  Insight:  Good  Psychomotor Activity:  Normal  Concentration:  Good  Recall:  Good  Akathisia:  Negative  Handed:  Right  AIMS (if indicated):     Assets:  Communication Skills Desire for Improvement Financial Resources/Insurance Housing Physical Health Resilience Talents/Skills  Sleep:   adequate   Ms Susman remains depressed with suicidal ideation but no intent or plan.  If she goes home, she is afraid depression will just get worse.  She takes citalopram which helps, Topomax which just makes her lose weight. She needs  To be inpatient at [facility] if possible and if not available then whatever can be found.

## 2013-08-02 NOTE — Progress Notes (Signed)
Recreation Therapy Notes  Date: 09.25.2014 Time: 2:30pm Location: [address] Dayroom  Group Topic: Software engineer Activities (AAA)  Behavioral Response: Engaged, Attentive, Appropriate  Affect: Euthymic  Clinical Observations/Feedback: Dog Team: [employer] Patient interacted appropriately with peer, dog team, LRT and MHT.   Marykay Lex Baileigh Modisette, LRT/CTRS  Arabela Basaldua L 08/02/2013 5:04 PM
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
## 2013-08-02 NOTE — Progress Notes (Signed)
[facility] provided pt with a [employer] Orange Card application, highlighting [facility].  °

## 2013-08-02 NOTE — Progress Notes (Signed)
Patient did attend the evening karaoke group. Pt was engaged and supportive but did not sing a song.   

## 2013-08-02 NOTE — Progress Notes (Signed)
Pt accepted to [address] Ladona Ridgel to Dr. Dub Mikes. Patient to be transported to [facility] under IVC by [employer]. RN and NP aware. Patient signed consent to release, and csw faxed to assessment.   Catha Gosselin, LCSW [phone]  ED CSW .08/02/2013 1013am

## 2013-08-02 NOTE — Progress Notes (Deleted)
Recreation Therapy Notes  Date: 09.25.2014 Time: 2:30pm Location: [address] Dayroom  Group Topic: Animal Assisted Activities (AAA)  Behavioral Response: Did not attend  Davis Ambrosini L Tannah Dreyfuss, LRT/CTRS  Jhostin Epps L 08/02/2013 5:04 PM 
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
## 2013-08-02 NOTE — Tx Team (Signed)
Initial Interdisciplinary Treatment Plan  PATIENT STRENGTHS: (choose at least two) Average or above average intelligence Capable of independent living Physical Health  PATIENT STRESSORS: Financial difficulties Legal issue   PROBLEM LIST: Problem List/Patient Goals Date to be addressed Date deferred Reason deferred Estimated date of resolution  depression 08/02/2013     anxiety 08/02/2013                                                DISCHARGE CRITERIA:  Improved stabilization in mood, thinking, and/or behavior Need for constant or close observation no longer present  PRELIMINARY DISCHARGE PLAN: Outpatient therapy Return to previous work or school arrangements  PATIENT/FAMIILY INVOLVEMENT: This treatment plan has been presented to and reviewed with the patient, Heidi Williamson.  The patient and family have been given the opportunity to ask questions and make suggestions.  Heidi Williamson M 08/02/2013, 2:39 PM

## 2013-08-03 DIAGNOSIS — F332 Major depressive disorder, recurrent severe without psychotic features: Secondary | ICD-10-CM

## 2013-08-03 DIAGNOSIS — F1010 Alcohol abuse, uncomplicated: Secondary | ICD-10-CM

## 2013-08-03 MED ORDER — TRAZODONE HCL 50 MG PO TABS
50.0000 mg | ORAL_TABLET | Freq: Every day | ORAL | Status: DC
  Administered 2013-08-03 – 2013-08-04 (×2): 50 mg via ORAL
  Filled 2013-08-03 (×4): qty 1

## 2013-08-03 MED ORDER — CITALOPRAM HYDROBROMIDE 40 MG PO TABS
40.0000 mg | ORAL_TABLET | Freq: Every day | ORAL | Status: DC
  Administered 2013-08-03 – 2013-08-06 (×4): 40 mg via ORAL
  Filled 2013-08-03 (×4): qty 1
  Filled 2013-08-03: qty 2
  Filled 2013-08-03: qty 1

## 2013-08-03 MED ORDER — ASPIRIN 81 MG PO CHEW
81.0000 mg | CHEWABLE_TABLET | Freq: Every day | ORAL | Status: DC
  Administered 2013-08-03 – 2013-08-07 (×5): 81 mg via ORAL
  Filled 2013-08-03 (×6): qty 1

## 2013-08-03 NOTE — B[facility] Suicide Risk Assessment (Signed)
Suicide Risk Assessment  Admission Assessment     Nursing information obtained from:  Patient Demographic factors:  Divorced or widowed;Caucasian;Low socioeconomic status;Living alone;Unemployed Current Mental Status:  Suicidal ideation indicated by patient Loss Factors:  Legal issues;Financial problems / change in socioeconomic status Historical Factors:  Prior suicide attempts;Family history of mental illness or substance abuse Risk Reduction Factors:  NA  CLINICAL FACTORS:   Severe Anxiety and/or Agitation Depression:   Anhedonia Comorbid alcohol abuse/dependence Hopelessness Impulsivity Insomnia Severe  COGNITIVE FEATURES THAT CONTRIBUTE TO RISK:  Thought constriction (tunnel vision)    SUICIDE RISK:   Moderate:  Frequent suicidal ideation with limited intensity, and duration, some specificity in terms of plans, no associated intent, good self-control, limited dysphoria/symptomatology, some risk factors present, and identifiable protective factors, including available and accessible social support.  PLAN OF CARE: Medication management as appropriate. Provide supportive counselling and education. Monitor for safety. Plan for discharge with appropriate follow up. I certify that inpatient services furnished can reasonably be expected to improve the patient's condition.  Kendalynn Wideman 08/03/2013, 10:52 AM

## 2013-08-03 NOTE — BHH Group Notes (Signed)
Adult Psychoeducational Group Note  Date:  08/03/2013 Time:  10:10 PM  Group Topic/Focus:  AA Meeting  Participation Level:  Active  Participation Quality:  Appropriate  Affect:  Appropriate  Cognitive:  Appropriate  Insight: Appropriate  Engagement in Group:  Engaged  Modes of Intervention:  Discussion and Education  Additional Comments:  Hindy attended group.  Caroll Rancher A 08/03/2013, 10:10 PM

## 2013-08-03 NOTE — Progress Notes (Signed)
Adult Psychoeducational Group Note  Date:  08/03/2013 Time:  10:58 AM  Group Topic/Focus:  Recovery Goals:   The focus of this group is to identify appropriate goals for recovery and establish a plan to achieve them.  Participation Level:  Active  Participation Quality:  Appropriate  Affect:  Appropriate and Depressed  Cognitive:  Appropriate  Insight: Good  Engagement in Group:  Improving  Modes of Intervention:  Activity and Discussion  Additional Comments:  PT was upset about medication mix-up. Pt was assured this problem would be taken care of. Pt was pulled out of group to speak with the doctor.    The focus of the group was SMART goals in relation to relapse and recovery. The SMART acronym was broken down and explained in detail. Each pt received a [facility] journal and was encouraged to write their goals, feelings, and questions down.   Tora Perches N 08/03/2013, 10:58 AM

## 2013-08-03 NOTE — [facility] Suicide Risk Assessment (Signed)
[facility] INPATIENT:  Family/Significant Other Suicide Prevention Education  Suicide Prevention Education:  Education Completed; Heidi Williamson (pt's mother) [phone] has been identified by the patient as the family member/significant other with whom the patient will be residing, and identified as the person(s) who will aid the patient in the event of a mental health crisis (suicidal ideations/suicide attempt).  With written consent from the patient, the family member/significant other has been provided the following suicide prevention education, prior to the and/or following the discharge of the patient.  The suicide prevention education provided includes the following:  Suicide risk factors  Suicide prevention and interventions  National Suicide Hotline telephone number  [facility] assessment telephone number  [facility] Emergency Assistance 911  [facility] and/or Residential Mobile Crisis Unit telephone number  Request made of family/significant other to:  Remove weapons (e.g., guns, rifles, knives), all items previously/currently identified as safety concern.    Remove drugs/medications (over-the-counter, prescriptions, illicit drugs), all items previously/currently identified as a safety concern.  The family member/significant other verbalizes understanding of the suicide prevention education information provided.  The family member/significant other agrees to remove the items of safety concern listed above.  Heidi Williamson, Heidi Williamson 08/03/2013, 3:47 PM

## 2013-08-03 NOTE — Progress Notes (Signed)
D: Patient denies SI/HI and A/V hallucinations; patient reports sleep is poor even thought she had medication; reports appetite is poor  ; reports energy level is low ; reports ability to pay attention is poor; rates depression as 9/10; rates hopelessness 10/10; rates anxiety as 10/10; patient reports neck and shoulder pain;   A: Monitored q 15 minutes; patient encouraged to attend groups; patient educated about medications; patient given medications per physician orders; patient encouraged to express feelings and/or concerns  R: Patient is labile and reports one thing to one staff and says something different this Clinical research associate or says nothing at all;  patient's interaction with staff and peers is appropriate; patient can have moments when she is irritable and she has to be redirected;patient is taking medications as prescribed and tolerating medications; patient is attending all groups
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
## 2013-08-03 NOTE — [facility] Group Notes (Signed)
[facility] LCSW Aftercare Discharge Planning Group Note   08/03/2013 9:50 AM  Participation Quality:  Appropriate  Mood/Affect:  Depressed  Depression Rating:  10  Anxiety Rating:  10  Thoughts of Suicide:  No Will you contract for safety?   NA  Current AVH:  No  Plan for Discharge/Comments:  Pt reports that it was her idea to come to [facility] (according to admission note, pt was IVC after reporting SI to [facility] psychiatrist). Pt states that she lives next door to her mother on her mother's land. They have a strained relationship and pt currently is unemployed, with no income, and no insurance. Pt reports mild withdrawals symptoms and said that she has Buerger's Disease, causing significant finger pain. She currently has no mental health providers. Pt interested in [facility] for med management, groups, and therapy.   Transportation Means: unknown at this time.   Supports: pt's mother is only identified support at this time.   Smart, [employer]

## 2013-08-03 NOTE — Progress Notes (Signed)
Recreation Therapy Notes  Date: 09.25.2014 Time: 3:00pm Location: [address] Dayroom  Group Topic: Leisure Social worker) Addresses:  Patient will identify leisure areas of interest. Patient will identify   Behavioral Response: Appropraite  Intervention: Informational Survey  Activity: Leisure Skills Checklist. Patient was asked to complete a survey to identify how they view leisure, as well as leisure areas of interest (social, health and wellness, intellectual).   Education:  Leisure Education, Building control surveyor  Education Outcome: Acknowledges understanding  Clinical Observations/Feedback: Patient completed survey and determined she is undecided about how she spends her leisure time. Patient made no additional statements about her personal leisure and passively engaged in group discussion about the meaningful use of leisure, primarily listening to statements of other while nodding her head in agreement with statements made by peers.   Heidi Williamson, LRT/CTRS  Heidi Williamson 08/03/2013 8:31 AM

## 2013-08-03 NOTE — [facility] Group Notes (Signed)
[facility] LCSW Group Therapy  08/03/2013 3:11 PM  Type of Therapy:  Group Therapy  Participation Level:  Active  Participation Quality:  Attentive  Affect:  Depressed  Cognitive:  Alert  Insight:  Improving  Engagement in Therapy:  Improving  Modes of Intervention:  Discussion, Education, Exploration, Socialization and Support  Summary of Progress/Problems: Feelings around Relapse. Group members discussed the meaning of relapse and shared personal stories of relapse, how it affected them and others, and how they perceived themselves during this time. Group members were encouraged to identify triggers, warning signs and coping skills used when facing the possibility of relapse. Social supports were discussed and explored in detail. Heidi Williamson was attentive and engaged throughout today's group. Heidi Williamson talked about how Heidi Williamson had driven all of her supports away by "acting out and being disrespectful." Heidi Williamson acknowledged the importance of having supports in her life in order to help her through recovery. Heidi Williamson identified "music and reading" as coping skills that help her calm down and decrease the severity of negative emotions. Heidi Williamson also talked about her need to be around others and how Heidi Williamson tends to isolate when feeling down or feeling a negative emotions. Heidi Williamson shows progress in the group setting AEB her ability to actively participate in group discussion.   Smart, Heidi Williamson 08/03/2013, 3:11 PM

## 2013-08-03 NOTE — Progress Notes (Signed)
Adult Psychoeducational Group Note  Date:  08/03/2013 Time:  10:52 AM  Group Topic/Focus:  Recovery Goals:   The focus of this group is to identify appropriate goals for recovery and establish a plan to achieve them.  Participation Level:  Active  Participation Quality:  Appropriate and Sharing  Affect:  Appropriate  Cognitive:  Appropriate  Insight: Good  Engagement in Group:  Engaged  Modes of Intervention:  Activity and Discussion  Additional Comments: The focus of the group was SMART goals in relation to relapse and recovery. The SMART acronym was broken down and explained in detail. Each pt received a [facility] journal and was encouraged to write their goals, feelings, and questions down.   Tora Perches N 08/03/2013, 10:52 AM
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
## 2013-08-04 LAB — WOUND CULTURE
Gram Stain: NONE SEEN
Special Requests: NORMAL

## 2013-08-04 MED ORDER — LORAZEPAM 1 MG PO TABS
1.0000 mg | ORAL_TABLET | Freq: Two times a day (BID) | ORAL | Status: DC | PRN
  Administered 2013-08-05 – 2013-08-07 (×5): 1 mg via ORAL
  Filled 2013-08-04 (×6): qty 1

## 2013-08-04 MED ORDER — BACITRACIN-NEOMYCIN-POLYMYXIN 400-5-5000 EX OINT
TOPICAL_OINTMENT | Freq: Two times a day (BID) | CUTANEOUS | Status: DC
  Administered 2013-08-04 – 2013-08-05 (×2): via TOPICAL
  Administered 2013-08-05: 1 via TOPICAL
  Filled 2013-08-04: qty 1

## 2013-08-04 NOTE — BHH Group Notes (Signed)
BHH Group Notes:  (Nursing/MHT/Case Management/Adjunct)  Date:  08/04/2013  Time:  3:26 PM  Type of Therapy:  Psychoeducational Skills  Participation Level:  Active  Participation Quality:  Appropriate  Affect:  Appropriate  Cognitive:  Appropriate  Insight:  Appropriate  Engagement in Group:  Engaged  Modes of Intervention:  Problem-solving  Summary of Progress/Problems: Pt attended healthy coping skills group, and was able to engage in treatment.   Jacquelyne Balint Shanta 08/04/2013, 3:26 PM

## 2013-08-04 NOTE — Progress Notes (Signed)
[facility] MD Progress Note  08/04/2013 6:56 PM Heidi Williamson  MRN:  [id number] Subjective:  Ketzia reports that overall she is doing well. She denies any cravings or withdrawal. She complains of some lower abdominal cramps. She reports that her depression is about a 6 or 7 on a scale of 1-10 where 10 is the worst, and rates her anxiety as a 10 out of 10. She denies any suicidal or homicidal ideation. She denies any auditory or visual hallucinations. She reports that her sleep and appetite are both good. Diagnosis:   DSM5:  Schizophrenia Disorders: none  Obsessive-Compulsive Disorders: denies  Trauma-Stressor Disorders: Posttraumatic Stress Disorder (309.81)  Substance/Addictive Disorders: Alcohol Related Disorder - Moderate (303.90)  Depressive Disorders: Major Depressive Disorder - Moderate (296.22)  AXIS I: Alcohol Abuse and Major Depression, Recurrent severe  AXIS II: Deferred  AXIS III:  Past Medical History   Diagnosis  Date   .  Hyperpotassemia    .  Bipolar 1 disorder    .  Breast abscess    .  GERD (gastroesophageal reflux disease)  10-05-11     not a problem   .  Berger's disease    AXIS IV: economic problems, occupational problems and other psychosocial or environmental problems  AXIS V: 41-50 serious symptoms  ADL's:  Intact  Sleep: Good  Appetite:  Good  Suicidal Ideation:  Patient denies any thought, plan, or intent Homicidal Ideation:  Patient denies any thought, plan, or intent AEB (as evidenced by):  Psychiatric Specialty Exam: Review of Systems  Constitutional: Negative.   HENT: Negative.   Eyes: Negative.   Respiratory: Negative.   Cardiovascular: Negative.   Gastrointestinal: Negative.   Genitourinary: Negative.   Musculoskeletal: Negative.   Skin:       Open wound on right anterior shin  Neurological: Negative.   Endo/Heme/Allergies: Negative.   Psychiatric/Behavioral: Positive for depression and substance abuse. The patient is nervous/anxious.      Blood pressure 103/63, pulse 88, temperature 97.3 F (36.3 C), resp. rate 16, height 5\' 5"  (1.651 m), last menstrual period 07/10/2013.There is no weight on file to calculate BMI.  General Appearance: Casual  Eye Contact::  Fair  Speech:  Clear and Coherent  Volume:  Normal  Mood:  Anxious and Dysphoric  Affect:  Congruent  Thought Process:  Linear  Orientation:  Full (Time, Place, and Person)  Thought Content:  WDL  Suicidal Thoughts:  No  Homicidal Thoughts:  No  Memory:  Immediate;   Fair Recent;   Fair Remote;   Fair  Judgement:  Impaired  Insight:  Lacking  Psychomotor Activity:  Normal  Concentration:  Good  Recall:  Good  Akathisia:  No  Handed:    AIMS (if indicated):     Assets:  Communication Skills Desire for Improvement  Sleep:  Number of Hours: 3   Current Medications: Current Facility-Administered Medications  Medication Dose Route Frequency Provider Last Rate Last Dose  . acetaminophen (TYLENOL) tablet 650 mg  650 mg Oral Q6H PRN Shuvon Rankin, NP      . alum & mag hydroxide-simeth (MAALOX/MYLANTA) 200-200-20 MG/5ML suspension 30 mL  30 mL Oral Q4H PRN Shuvon Rankin, NP      . aspirin chewable tablet 81 mg  81 mg Oral Daily Himabindu Ravi, MD   81 mg at 08/04/13 0824  . calcium-vitamin D (OSCAL WITH D) 500-200 MG-UNIT per tablet 1 tablet  1 tablet Oral Q breakfast Shuvon Rankin, NP   1 tablet at 08/04/13  1610  . cephALEXin (KEFLEX) capsule 500 mg  500 mg Oral Q12H Shuvon Rankin, NP   500 mg at 08/04/13 0824  . citalopram (CELEXA) tablet 40 mg  40 mg Oral QHS Himabindu Ravi, MD   40 mg at 08/03/13 2204  . ibuprofen (ADVIL,MOTRIN) tablet 600 mg  600 mg Oral Q8H PRN Shuvon Rankin, NP   600 mg at 08/04/13 1608  . LORazepam (ATIVAN) tablet 1 mg  1 mg Oral Q8H PRN Shuvon Rankin, NP   1 mg at 08/04/13 1608  . lurasidone (LATUDA) tablet 20 mg  20 mg Oral Daily Shuvon Rankin, NP   20 mg at 08/04/13 0824  . magnesium hydroxide (MILK OF MAGNESIA) suspension 30 mL  30 mL  Oral Daily PRN Shuvon Rankin, NP      . nicotine (NICODERM CQ - dosed in mg/24 hours) patch 21 mg  21 mg Transdermal Daily Shuvon Rankin, NP   21 mg at 08/04/13 0826  . ondansetron (ZOFRAN) tablet 4 mg  4 mg Oral Q8H PRN Shuvon Rankin, NP      . pantoprazole (PROTONIX) EC tablet 40 mg  40 mg Oral Daily Shuvon Rankin, NP   40 mg at 08/04/13 0824  . traZODone (DESYREL) tablet 50 mg  50 mg Oral QHS Himabindu Ravi, MD   50 mg at 08/03/13 2319    Lab Results: No results found for this or any previous visit (from the past 48 hour(s)).  Physical Findings: AIMS:  , ,  ,  ,    CIWA:    COWS:     Treatment Plan Summary: Daily contact with patient to assess and evaluate symptoms and progress in treatment Medication management  Plan: Observation Level/Precautions:  Detox  Laboratory:  Per emergency department  Psychotherapy:   attend groups  Medications:   Continue Celexa, and Latuda, will have to decrease frequency of available PRN  Lorazepam to Q12 hours.  Consultations:  None  Discharge Concerns:  risk for relapse  Estimated LOS: 3-5 days  Other:     Medical Decision Making Problem Points:  Established problem, stable/improving (1) and Review of psycho-social stressors (1) Data Points:  Review or order clinical lab tests (1) Review of medication regiment & side effects (2)  I certify that inpatient services furnished can reasonably be expected to improve the patient's condition.   WATT,ALAN 08/04/2013, 6:56 PM  Discussed with provider. Reviewed note. Agree with above findings, assessment and plan.

## 2013-08-04 NOTE — Progress Notes (Signed)
Psychoeducational Group Note  Date:  06/17/2012 Time: 1300  Group Topic/Focus:  Identifying Needs:   The focus of this group is to help patients identify their personal needs that have been historically problematic and identify healthy behaviors to address their needs.  Participation Level:  Active  Participation Quality: good  Affect: fla t Cognitive:limited    Insight:  good  Engagement in Group: engaged  Additional Comments:   PD RN [facility]

## 2013-08-04 NOTE — BHH Group Notes (Signed)
BHH Group Notes: (Clinical Social Work)   08/04/2013      Type of Therapy:  Group Therapy   Participation Level:  Did Not Attend    Ambrose Mantle, LCSW 08/04/2013, 1:02 PM

## 2013-08-04 NOTE — Progress Notes (Signed)
D Heidi Williamson has fared well today with her detox. She has requested  And  received 2 prn doses of po ativan ( for c/o withdrawal ) and also she has been given 2 doses of prn ibuprofen 600 mg for c/o right shin discomfort.   A  She completed her AM self inventory and on it she wrote she denied SI within the past 24 hrs, she rates her depression and hopelessness "8/9" and she stated her DC plan is to " think of my home".   R Safety is in place and POC maintained.

## 2013-08-04 NOTE — Progress Notes (Signed)
Patient ID: Heidi Williamson, female   DOB: 18-Oct-1971, 42 y.o.   MRN: [id number] D)  Pt was irritable when she came to the med window this evening.  Stated she felt upset over something that had been said in group but didn't want to discuss it, but stated she had been to groups like that before and she didn't like it or the guy who led the group, wouldn't elaborate.  Was also upset that some of her meds, eg fish oil, vitamins, etc., hadn't been ordered.  Was given pitcher of gatorade, was medicated for pain and anxiety, was feeling better afterward.   A)  Encouraged her to speak with MD in am about getting home meds ordered, encouraged to go back to group, will continue to monitor for safety, continue POC R)  Safety maintained, less anxious, irritable after medicated, stated w/d sx improved.

## 2013-08-05 ENCOUNTER — Telehealth (HOSPITAL_COMMUNITY): Payer: Self-pay | Admitting: Emergency Medicine

## 2013-08-05 MED ORDER — ADULT MULTIVITAMIN W/MINERALS CH
1.0000 | ORAL_TABLET | Freq: Every day | ORAL | Status: DC
  Administered 2013-08-06 – 2013-08-07 (×2): 1 via ORAL
  Filled 2013-08-05 (×3): qty 1

## 2013-08-05 MED ORDER — TRAZODONE HCL 50 MG PO TABS
50.0000 mg | ORAL_TABLET | Freq: Every evening | ORAL | Status: DC | PRN
  Administered 2013-08-05 – 2013-08-06 (×3): 50 mg via ORAL
  Filled 2013-08-05 (×6): qty 1

## 2013-08-05 MED ORDER — ENSURE COMPLETE PO LIQD
237.0000 mL | Freq: Two times a day (BID) | ORAL | Status: DC
  Administered 2013-08-05 – 2013-08-07 (×4): 237 mL via ORAL

## 2013-08-05 NOTE — Progress Notes (Signed)
Received a call from the lab for results of staphylococcus aureus to right leg wound, results were called to Jorje Guild PA. No orders noted.

## 2013-08-05 NOTE — Telephone Encounter (Signed)
+   wound culture. Shalita RN @ [facility] notifiied of + wound culture that was collected while patient was in ED.

## 2013-08-05 NOTE — Progress Notes (Signed)
Patient ID: Heidi Williamson, female   DOB: [birth year], 42 y.o.   MRN: [id number] D)  Was smiling and pleasant this evening, interacting appropriately with staff and peers, attended group and was more positive about the meeting tonight.  Wound on rt shin was dry, had been left open to air, pt washed site and new dsg with neosporin applied, no drainage had been noted at this time.  States still feeling anxious, but otherwise denies w/d sx, denies SI/HI/AVH.  Stated worries about her mother's health, and her own as well, focused on long term when she leaves, wants to take better care of herself.   Fluids encouraged to improve hydration.  A)  Support, will continue to monitor for safety, w/d sx, and continue POC R)  Safety maintained.

## 2013-08-05 NOTE — BHH Group Notes (Signed)
BHH Group Notes:  (Clinical Social Work)  08/05/2013  10:00-11:00AM  Summary of Progress/Problems:   The main focus of today's process group was to   identify the patient's current support system and decide on other supports that can be put in place.  The picture on workbook was used to discuss why additional supports are needed, and a hand-out was distributed with four definitions/levels of support, then used to talk about how patients have given and received all different kinds of support.  An emphasis was placed on using counselor, doctor, therapy groups, 12-step groups, and problem-specific support groups to expand supports.  The patient identified several supports but did not participate much in discussion, kept falling asleep and eventually left the room.  Type of Therapy:  Process Group with Motivational Interviewing  Participation Level:  Minimal  Participation Quality:  Drowsy  Affect:  Depressed  Cognitive:  Oriented  Insight:  Limited  Engagement in Therapy:  Lacking  Modes of Intervention:   Education, Support and Processing, Activity  Ambrose Mantle, LCSW 08/05/2013, 12:36 PM

## 2013-08-05 NOTE — Progress Notes (Signed)
[facility] MD Progress Note  08/05/2013 4:00 PM Heidi Williamson  MRN:  [id number] Subjective:  Patient perseverates over multiple psychosocial stressors. Has many health concerns, and is distrustful of doctors. He expresses a significant amount appeared ability and agitation. Seems confused and somewhat disorganized. Reports she slept well to the point of urinating in the bed last night, then could not sleep the second half of the night. Denies any suicidal or homicidal ideation. Denies any auditory or visual hallucinations. Denies any cravings or withdrawal symptoms. Diagnosis:   DSM5:  Schizophrenia Disorders: none  Obsessive-Compulsive Disorders: denies  Trauma-Stressor Disorders: Posttraumatic Stress Disorder (309.81)  Substance/Addictive Disorders: Alcohol Related Disorder - Moderate (303.90)  Depressive Disorders: Major Depressive Disorder - Moderate (296.22)  AXIS I: Alcohol Abuse and Major Depression, Recurrent severe  AXIS II: Deferred  AXIS III:  Past Medical History   Diagnosis  Date   .  Hyperpotassemia    .  Bipolar 1 disorder    .  Breast abscess    .  GERD (gastroesophageal reflux disease)  10-05-11     not a problem   .  Berger's disease    AXIS IV: economic problems, occupational problems and other psychosocial or environmental problems  AXIS V: 41-50 serious symptoms  ADL's:  Intact  Sleep: Fair  Appetite:  Fair  Suicidal Ideation:  Patient denies any thoughts, plan, or intent Homicidal Ideation:  Patient denies any thoughts, plan or intent AEB (as evidenced by):  Psychiatric Specialty Exam: Review of Systems  Constitutional: Negative.   HENT: Negative.   Eyes: Negative.   Respiratory: Negative.   Cardiovascular: Negative.   Gastrointestinal: Negative.   Genitourinary: Negative.   Musculoskeletal: Negative.   Skin: Negative.   Neurological: Negative.   Endo/Heme/Allergies: Negative.   Psychiatric/Behavioral: Positive for depression. Negative for suicidal  ideas and hallucinations. The patient is nervous/anxious. The patient does not have insomnia.     Blood pressure 105/74, pulse 76, temperature 97.9 F (36.6 C), resp. rate 15, height 5\' 5"  (1.651 m), last menstrual period 07/10/2013.There is no weight on file to calculate BMI.  General Appearance: Casual  Eye Contact::  Minimal  Speech:  Clear and Coherent  Volume:  Normal  Mood:  Anxious and Irritable  Affect:  Congruent  Thought Process:  Disorganized and Tangential  Orientation:  Full (Time, Place, and Person)  Thought Content:  Obsessions and Rumination  Suicidal Thoughts:  No  Homicidal Thoughts:  No  Memory:  Immediate;   Fair Recent;   Fair Remote;   Fair  Judgement:  Impaired  Insight:  Lacking  Psychomotor Activity:  Normal  Concentration:  Fair  Recall:  Fair  Akathisia:  No  Handed:    AIMS (if indicated):     Assets:  Communication Skills Desire for Improvement  Sleep:  Number of Hours: 4.75   Current Medications: Current Facility-Administered Medications  Medication Dose Route Frequency Provider Last Rate Last Dose  . acetaminophen (TYLENOL) tablet 650 mg  650 mg Oral Q6H PRN Shuvon Rankin, NP      . alum & mag hydroxide-simeth (MAALOX/MYLANTA) 200-200-20 MG/5ML suspension 30 mL  30 mL Oral Q4H PRN Shuvon Rankin, NP      . aspirin chewable tablet 81 mg  81 mg Oral Daily Himabindu Ravi, MD   81 mg at 08/05/13 0807  . calcium-vitamin D (OSCAL WITH D) 500-200 MG-UNIT per tablet 1 tablet  1 tablet Oral Q breakfast Shuvon Rankin, NP   1 tablet at 08/05/13 0808  .  cephALEXin (KEFLEX) capsule 500 mg  500 mg Oral Q12H Shuvon Rankin, NP   500 mg at 08/05/13 4098  . citalopram (CELEXA) tablet 40 mg  40 mg Oral QHS Himabindu Ravi, MD   40 mg at 08/04/13 2213  . feeding supplement (ENSURE COMPLETE) liquid 237 mL  237 mL Oral BID BM Jorje Guild, PA-C      . ibuprofen (ADVIL,MOTRIN) tablet 600 mg  600 mg Oral Q8H PRN Shuvon Rankin, NP   600 mg at 08/05/13 0807  . LORazepam  (ATIVAN) tablet 1 mg  1 mg Oral BID PRN Larena Sox, MD   1 mg at 08/05/13 1428  . lurasidone (LATUDA) tablet 20 mg  20 mg Oral Daily Shuvon Rankin, NP   20 mg at 08/05/13 0807  . magnesium hydroxide (MILK OF MAGNESIA) suspension 30 mL  30 mL Oral Daily PRN Shuvon Rankin, NP      . [START ON 08/06/2013] multivitamin with minerals tablet 1 tablet  1 tablet Oral Daily Jorje Guild, PA-C      . neomycin-bacitracin-polymyxin (NEOSPORIN) ointment   Topical BID Jorje Guild, PA-C   1 application at 08/05/13 [phone]  . nicotine (NICODERM CQ - dosed in mg/24 hours) patch 21 mg  21 mg Transdermal Daily Shuvon Rankin, NP   21 mg at 08/05/13 0807  . ondansetron (ZOFRAN) tablet 4 mg  4 mg Oral Q8H PRN Shuvon Rankin, NP      . pantoprazole (PROTONIX) EC tablet 40 mg  40 mg Oral Daily Shuvon Rankin, NP   40 mg at 08/05/13 0807  . traZODone (DESYREL) tablet 50 mg  50 mg Oral QHS Himabindu Ravi, MD   50 mg at 08/04/13 2257    Lab Results: No results found for this or any previous visit (from the past 48 hour(s)).  Physical Findings: AIMS:  , ,  ,  ,    CIWA:    COWS:     Treatment Plan Summary: Daily contact with patient to assess and evaluate symptoms and progress in treatment Medication management  Plan: We will increase her trazodone so that she may repeat x1. We will resume her multivitamin in the morning per her request, and allow her to have Ensure nutritional supplement between meals because of her poor appetite. Medical Decision Making Problem Points:  Established problem, stable/improving (1) and Review of psycho-social stressors (1) Data Points:  Review or order clinical lab tests (1) Review of medication regiment & side effects (2)  I certify that inpatient services furnished can reasonably be expected to improve the patient's condition.   WATT,ALAN 08/05/2013, 4:00 PM  Discussed with provider. Reviewed note. Agree with above findings, assessment and plan.   Jacqulyn Cane, M.D.  08/05/2013  11:31 PM

## 2013-08-05 NOTE — Progress Notes (Addendum)
D Heidi Williamson has had a hard time today..dealing with her ever-changing emotions and her feelings. She fascillates from tears and sadness to overwhelming anxiety. SHe cries, she pounds her fists, she stays upset.   A SHe completed her AM self inventory and on it she writes she  she denied SI within the past 24 hrs and she rated her depression and hopelessness "10/10".   r Safety is in place and maintaiend and poc moves forward.

## 2013-08-06 DIAGNOSIS — F39 Unspecified mood [affective] disorder: Secondary | ICD-10-CM | POA: Diagnosis present

## 2013-08-06 MED ORDER — SULFAMETHOXAZOLE-TRIMETHOPRIM 400-80 MG PO TABS: 1.0000 | ORAL_TABLET | Freq: Two times a day (BID) | ORAL | Status: DC

## 2013-08-06 MED ORDER — INFLUENZA VAC SPLIT QUAD 0.5 ML IM SUSP
0.5000 mL | INTRAMUSCULAR | Status: AC
  Administered 2013-08-07: 0.5 mL via INTRAMUSCULAR
  Filled 2013-08-06: qty 0.5

## 2013-08-06 MED ORDER — BACITRACIN-NEOMYCIN-POLYMYXIN OINTMENT TUBE
TOPICAL_OINTMENT | Freq: Two times a day (BID) | CUTANEOUS | Status: DC
  Administered 2013-08-06 – 2013-08-07 (×3): via TOPICAL
  Filled 2013-08-06: qty 15

## 2013-08-06 NOTE — Progress Notes (Signed)
D:Pt continues to rate her depression and hopelessness as a 10 on 1-10 scale with 10 being the most. Pt c/o constipation, agitation, tremors and confusion. Pt mood is lablile and irritable.  A:Offered support and gave medication as ordered. Offered prn medication for constipation and pt refused stating that she had some BM this morning. Encouraged pt to increase fluids. T:Pt denies si. Safety maintained on the unit.

## 2013-08-06 NOTE — [facility] Group Notes (Signed)
[facility] LCSW Group Therapy  08/06/2013 4:00 PM  Type of Therapy:  Group Therapy  Participation Level:  Active  Participation Quality:  Appropriate  Affect:  Irritable and Labile  Cognitive:  Alert  Insight:  Limited  Engagement in Therapy:  Limited  Modes of Intervention:  Discussion, Education, Exploration, Socialization and Support  Summary of Progress/Problems: Today's Topic: Overcoming Obstacles. Pt identified obstacles faced currently and processed barriers involved in overcoming these obstacles. Pt identified steps necessary for overcoming these obstacles and explored motivation (internal and external) for facing these difficulties head on. Pt further identified one area of concern in their lives and chose a skill of focus pulled from their "toolbox." Heidi Williamson was attentive and engaged throughout today's therapy group. She stated that her biggest obstacles include "creating a new support system, dealing with the mess at my house and getting along with my mother." Heidi Williamson was encouraged to share her issues with the current support network and her problems at home. Group members offered support and encouragement for dealing with these problems. Heidi Williamson was able to identify "going to [facility], moving to [place] when I complete treatment and getting into a new environment, and getting stabilized on meds/taking them as prescribed" as ways to overcome these obstacles. Heidi Williamson shows some progress in the group setting and improving/limited insight AEB her ability to actively participate in group discussion and explore ways to deal with the obstacles that she anticipates facing after d/c.    Smart, Taron Mondor 08/06/2013, 4:00 PM

## 2013-08-06 NOTE — Progress Notes (Signed)
Patient ID: Heidi Williamson, female   DOB: 08-23-1971, 42 y.o.   MRN: [id number] D: pt. Reports anxiety at "10" of 10 and notes decreased pain level at "5". "I had Maalox earlier and it's finally working"  Pt. Notes she will be going to [facility] at discharge. A: Writer introduced self to client and encouraged and provided emotional support by reviewing meds and encouraging her to work with the detox program, incorporating coping skills.  Writer encouraged group. Staff will monitor q59min for safety. R: Pt. Is safe on the unit and attended group.

## 2013-08-06 NOTE — Tx Team (Signed)
Interdisciplinary Treatment Plan Update (Adult)  Date: 08/06/2013   Time Reviewed: 10:34 AM  Progress in Treatment:  Attending groups: Yes Participating in groups:   Yes Taking medication as prescribed: Yes  Tolerating medication: Yes  Family/Significant othe contact made: Contact made and SPE completed with pt's mother.  Patient understands diagnosis: Yes, AEB seeking treatment for mood stabilization, SI, and substance abuse (marijuana) Discussing patient identified problems/goals with staff: Yes  Medical problems stabilized or resolved: Yes  Denies suicidal/homicidal ideation: Yes in group/self report. Patient has not harmed self or Others: Yes  New problem(s) identified: n/a  Discharge Plan or Barriers: Pt to follow up at [facility] for med management, group therapy, and individual therapy. She plans to return home after d/c. Pt is now interested in getting [facility] screening date.  Additional comments: Pt reporting and presenting with extreme mood lability and is rating depression/anxiety as 10. Pt reports foggy thinking, agitation, and reports difficulty eating/sleeping.  Reason for Continuation of Hospitalization: Mood stabilization Medication management  Estimated length of stay: 1-3 days  For review of initial/current patient goals, please see plan of care.  Attendees:  Patient:    Family:    Physician: Dr. Dub Mikes MD  08/06/2013 10:34 AM   Nursing: Sue Lush RN  08/06/2013 10:34 AM   Clinical Social Worker Tali Coster Smart, LCSWA  08/06/2013 10:34 AM   Other: Chandra Batch. PA  08/06/2013 10:34 AM  Other:    Other: Darden Dates Nurse CM 08/06/2013 10:34 AM   Other:    Scribe for Treatment Team:  [employer] LCSWA 08/06/2013 10:34 AM

## 2013-08-06 NOTE — Progress Notes (Signed)
Received triple antibiotic ointment from pharmacy and applied dressing. Wound covered dry and intact.

## 2013-08-06 NOTE — Progress Notes (Signed)
Patient ID: Heidi Williamson, female   DOB: [birth year], 42 y.o.   MRN: [id number] D)  Has been irritable all evening, negative, sarcastic came to nsg station to report tech for not doing her laundry.  He had told her the dryer was broken, she reported him anyway, and was told again by staff that the dryer was broken.  Walked away angry and tearful, came back to med window later and said she had thrown her clothes away that she had been wearing when she wet the bed.  States her meds aren't right, needs something more for her nerves but had taken the 2 doses of ativan  she could have for the day.  States she wants long term but afraid she'll be discharged before it's arranged.  States coming here was a mistake, walked away. A)  Will continue to monitor for safety, continue POC R)  Safety maintained.

## 2013-08-06 NOTE — Clinical Social Work Note (Signed)
Per request of pt, CSW emailed letter to attorney Marisue Ivan Loftin that included admission date, diagnosis, and treatment plan [email or web address]. Phone: [phone]. ROI signed by pt.

## 2013-08-06 NOTE — Progress Notes (Signed)
[facility] MD Progress Note  08/06/2013 4:49 PM Heidi Williamson  MRN:  [id number] Subjective:  Heidi Williamson was very agitated. Stated that she has not been able to get the help she needs. Claims she did not get it from [employer] or [place] nor [employer] and now not here. Claimed that the change in medications is not helping, does not feel like herself. Claims she went trying to get help for his suicidal ideas (Does not mention her substance abuse)  (When we tried to clarify and get more history she was uncooperative, somewhat hostile) Did not want to continue the conversation Diagnosis:   DSM5: Schizophrenia Disorders:   Obsessive-Compulsive Disorders:   Trauma-Stressor Disorders:   Substance/Addictive Disorders:  Alcohol Related Disorder - Moderate (303.90) Depressive Disorders:  Major Depressive Disorder - Severe (296.23)  Axis I: Mood Disorder NOS  ADL's:  Intact  Sleep: Poor  Appetite:  Fair  Suicidal Ideation:  Plan:  denies Intent:  denies Means:  denies Homicidal Ideation:  Plan:  denies Intent:  denies Means:  denies AEB (as evidenced by):  Psychiatric Specialty Exam: Review of Systems  Constitutional: Negative.   HENT: Negative.   Eyes: Negative.   Respiratory: Negative.   Cardiovascular: Negative.   Gastrointestinal: Negative.   Genitourinary: Negative.   Musculoskeletal: Negative.   Skin: Negative.   Neurological: Negative.   Endo/Heme/Allergies: Negative.   Psychiatric/Behavioral: Positive for depression and substance abuse. The patient is nervous/anxious and has insomnia.     Blood pressure 101/55, pulse 70, temperature 97.4 F (36.3 C), temperature source Oral, resp. rate 18, height 5\' 5"  (1.651 m), last menstrual period 07/10/2013.There is no weight on file to calculate BMI.  General Appearance: Fairly Groomed  Patent attorney::  Minimal  Speech:  Clear and Coherent and guarded  Volume:  fluctuates  Mood:  Angry, Anxious and Irritable  Affect:  anxious,  irritable, agitated  Thought Process:  Coherent and Goal Directed  Orientation:  Full (Time, Place, and Person)  Thought Content:  complains, a lot of projection, frustration  Suicidal Thoughts:  Yes.  without intent/plan  Homicidal Thoughts:  No  Memory:  Immediate;   Fair Recent;   Fair Remote;   Fair  Judgement:  Poor  Insight:  superficial, limited  Psychomotor Activity:  Restlessness  Concentration:  Fair  Recall:  Fair  Akathisia:  No  Handed:    AIMS (if indicated):     Assets:  Desire for Improvement  Sleep:  Number of Hours: 5   Current Medications: Current Facility-Administered Medications  Medication Dose Route Frequency Provider Last Rate Last Dose  . acetaminophen (TYLENOL) tablet 650 mg  650 mg Oral Q6H PRN Shuvon Rankin, NP   650 mg at 08/05/13 2233  . alum & mag hydroxide-simeth (MAALOX/MYLANTA) 200-200-20 MG/5ML suspension 30 mL  30 mL Oral Q4H PRN Shuvon Rankin, NP   30 mL at 08/05/13 1924  . aspirin chewable tablet 81 mg  81 mg Oral Daily Himabindu Ravi, MD   81 mg at 08/06/13 0809  . calcium-vitamin D (OSCAL WITH D) 500-200 MG-UNIT per tablet 1 tablet  1 tablet Oral Q breakfast Shuvon Rankin, NP   1 tablet at 08/06/13 0816  . cephALEXin (KEFLEX) capsule 500 mg  500 mg Oral Q12H Shuvon Rankin, NP   500 mg at 08/06/13 0809  . citalopram (CELEXA) tablet 40 mg  40 mg Oral QHS Himabindu Ravi, MD   40 mg at 08/05/13 2142  . feeding supplement (ENSURE COMPLETE) liquid 237 mL  237 mL Oral BID BM Jorje Guild, PA-C   237 mL at 08/06/13 1415  . ibuprofen (ADVIL,MOTRIN) tablet 600 mg  600 mg Oral Q8H PRN Shuvon Rankin, NP   600 mg at 08/06/13 0921  . [START ON 08/07/2013] influenza vac split quadrivalent PF (FLUARIX) injection 0.5 mL  0.5 mL Intramuscular Tomorrow-1000 Rachael Fee, MD      . LORazepam (ATIVAN) tablet 1 mg  1 mg Oral BID PRN Larena Sox, MD   1 mg at 08/06/13 0818  . lurasidone (LATUDA) tablet 20 mg  20 mg Oral Daily Shuvon Rankin, NP   20 mg at 08/06/13  0809  . magnesium hydroxide (MILK OF MAGNESIA) suspension 30 mL  30 mL Oral Daily PRN Shuvon Rankin, NP      . multivitamin with minerals tablet 1 tablet  1 tablet Oral Daily Jorje Guild, PA-C   1 tablet at 08/06/13 0810  . neomycin-bacitracin-polymyxin (NEOSPORIN) ointment   Topical BID Rachael Fee, MD      . nicotine (NICODERM CQ - dosed in mg/24 hours) patch 21 mg  21 mg Transdermal Daily Shuvon Rankin, NP   21 mg at 08/06/13 0819  . ondansetron (ZOFRAN) tablet 4 mg  4 mg Oral Q8H PRN Shuvon Rankin, NP      . pantoprazole (PROTONIX) EC tablet 40 mg  40 mg Oral Daily Shuvon Rankin, NP   40 mg at 08/06/13 0809  . traZODone (DESYREL) tablet 50 mg  50 mg Oral QHS,MR X 1 Jorje Guild, PA-C   50 mg at 08/05/13 2318    Lab Results: No results found for this or any previous visit (from the past 48 hour(s)).  Physical Findings: AIMS:  , ,  ,  ,    CIWA:  CIWA-Ar Total: 7 COWS:     Treatment Plan Summary: Daily contact with patient to assess and evaluate symptoms and progress in treatment Medication management  Plan: Supportive approach/coping skills/relapse prevention           Reassess her response to the medications           Get more information (collateral?)            Better clarify her concerns, symptoms not responding to medications  Medical Decision Making Problem Points:  Review of psycho-social stressors (1) Data Points:  Review of medication regiment & side effects (2) Review of new medications or change in dosage (2)  I certify that inpatient services furnished can reasonably be expected to improve the patient's condition.   Nevin Kozuch A 08/06/2013, 4:49 PM

## 2013-08-06 NOTE — [facility] Group Notes (Signed)
[facility] LCSW Aftercare Discharge Planning Group Note   08/06/2013 9:19 AM  Participation Quality:  Appropriate   Mood/Affect:  Irritable and Labile  Depression Rating:  10  Anxiety Rating:  10  Thoughts of Suicide:  No Will you contract for safety?   NA  Current AVH:  No  Plan for Discharge/Comments:  Pt stated that she is 'feeling awful' today and complains of medications being switched over the weekend, weekend staff not "paying me any attention," feeling "foggy headed," and "not eating or sleeping well." Pt stated that she does not feel like herself and is also worried about her mother, being that she tripped and fell over the weekend. Pt complains of back and neck pain and issues from her disorder (Buerger's d/o). Pt also complains of roommate issues/temperature is too cold in her room and her roommate is "always in there sleeping." Pt requesting [facility] screening date and plans to follow up at [facility] for med management.   Transportation Means: unknown at this time.   Supports: pt's mother  Smart, Herbert Seta

## 2013-08-07 DIAGNOSIS — F331 Major depressive disorder, recurrent, moderate: Principal | ICD-10-CM

## 2013-08-07 MED ORDER — LURASIDONE HCL 20 MG PO TABS: 20.0000 mg | ORAL_TABLET | Freq: Every day | ORAL | Status: DC

## 2013-08-07 MED ORDER — CEPHALEXIN 500 MG PO CAPS: 500.0000 mg | ORAL_CAPSULE | Freq: Two times a day (BID) | ORAL | Status: DC

## 2013-08-07 MED ORDER — CALCIUM CARBONATE-VITAMIN D 500-200 MG-UNIT PO TABS: 1.0000 | ORAL_TABLET | Freq: Every day | ORAL | Status: DC

## 2013-08-07 MED ORDER — THERA M PLUS PO TABS: 1.0000 | ORAL_TABLET | Freq: Every day | ORAL | Status: AC

## 2013-08-07 MED ORDER — MAGNESIUM 250 MG PO TABS: 500.0000 mg | ORAL_TABLET | Freq: Every day | ORAL | Status: DC

## 2013-08-07 MED ORDER — BACITRACIN-NEOMYCIN-POLYMYXIN OINTMENT TUBE: 1.0000 "application " | TOPICAL_OINTMENT | Freq: Two times a day (BID) | CUTANEOUS | Status: DC

## 2013-08-07 MED ORDER — CITALOPRAM HYDROBROMIDE 40 MG PO TABS: 40.0000 mg | ORAL_TABLET | Freq: Every day | ORAL | Status: DC

## 2013-08-07 MED ORDER — OMEPRAZOLE 20 MG PO CPDR: 20.0000 mg | DELAYED_RELEASE_CAPSULE | Freq: Every day | ORAL | Status: DC

## 2013-08-07 MED ORDER — IBUPROFEN 200 MG PO TABS: 200.0000 mg | ORAL_TABLET | Freq: Two times a day (BID) | ORAL | Status: DC

## 2013-08-07 MED ORDER — ASPIRIN EC 81 MG PO TBEC: 81.0000 mg | DELAYED_RELEASE_TABLET | Freq: Every day | ORAL | Status: DC

## 2013-08-07 MED ORDER — TRAZODONE HCL 50 MG PO TABS: 50.0000 mg | ORAL_TABLET | Freq: Every evening | ORAL | Status: DC | PRN

## 2013-08-07 NOTE — [facility] Suicide Risk Assessment (Signed)
Suicide Risk Assessment  Discharge Assessment     Demographic Factors:  Caucasian  Mental Status Per Nursing Assessment::   On Admission:  Suicidal ideation indicated by patient  Current Mental Status by Physician: In full contact with reality. Denies suicidal ideas, plans or intent. Her mood is irritable, her affect is appropriate. She denies active S/S of withdrawal. She got an appointment to go to [facility] treatment next Monday. She wants to be D/C to take care of things she has to take care of before she goes in. She is going to follow up with [facility]   Loss Factors: Decline in physical health  Historical Factors: NA  Risk Reduction Factors:   Positive social support  Continued Clinical Symptoms:  Depression:   Comorbid alcohol abuse/dependence Insomnia  Cognitive Features That Contribute To Risk:  Closed-mindedness Polarized thinking Thought constriction (tunnel vision)    Suicide Risk:  Minimal: No identifiable suicidal ideation.  Patients presenting with no risk factors but with morbid ruminations; may be classified as minimal risk based on the severity of the depressive symptoms  Discharge Diagnoses:   AXIS I:  Mood Disorder NOS, Depressive Disorder NOS, Substance Abuse AXIS II:  Deferred AXIS III:   Past Medical History  Diagnosis Date  . Hyperpotassemia   . Bipolar 1 disorder   . Breast abscess   . GERD (gastroesophageal reflux disease) 10-05-11    not a problem  . Berger's disease    AXIS IV:  other psychosocial or environmental problems AXIS V:  61-70 mild symptoms  Plan Of Care/Follow-up recommendations:  Activity:  as tolerated Diet:  regular Follow up [facility], [facility] Is patient on multiple antipsychotic therapies at discharge:  No   Has Patient had three or more failed trials of antipsychotic monotherapy by history:  No  Recommended Plan for Multiple Antipsychotic Therapies: NA  Claiborne Stroble A 08/07/2013, 12:10 PM

## 2013-08-07 NOTE — Progress Notes (Signed)
[facility] Adult Case Management Discharge Plan :  Will you be returning to the same living situation after discharge: Yes,  home until [facility] admission on OCT 6th At discharge, do you have transportation home?:Yes,  pt's mother Do you have the ability to pay for your medications:Yes,  mental health  Release of information consent forms completed and in the chart;  Patient's signature needed at discharge.  Patient to Follow up at: Follow-up Information   Follow up with [facility]. (Walk in between 8AM-12PM Monday through Friday for hospital followup/assessment for therapy services and medication management. )    Contact information:   [address], [place] [address] Phone: [phone] Fax: [phone]      Follow up with [facility] On 08/13/2013. (Arrive at 8AM with ID, 30 day medication supply, and clothing. )    Contact information:   [address] [place], [place] [address] Phone: [phone] Fax: [phone]      Patient denies SI/HI:   Yes,  during group/self report.    Safety Planning and Suicide Prevention discussed:  Yes,  SPE completed with pt's mother. SPI pamphlet provided to pt and she was encouraged to share with her support network.   Smart, Granville Whitefield 08/07/2013, 10:23 AM
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
## 2013-08-07 NOTE — Progress Notes (Signed)
Patient ID: Heidi Williamson, female   DOB: May 12, 1971, 42 y.o.   MRN: [id number] Patient discharged per physician order; patient denies SI/HI and A/V hallucinations; patient received samples, prescriptions, and copy of AVS after it was reviewed; patient had no other questions or concerns at this time; patient verbalized and signed that she received all belongings; patient left the unit ambulatory with her mom

## 2013-08-07 NOTE — ED Provider Notes (Signed)
Medical screening examination/treatment/procedure(s) were performed by non-physician practitioner and as supervising physician I was immediately available for consultation/collaboration.  Raeford Razor, MD 08/07/13 [phone]

## 2013-08-07 NOTE — [facility] Group Notes (Signed)
The focus of this group is to educate the patient on the purpose and policies of crisis stabilization and provide a format to answer questions about their admission.  The group details unit policies and expectations of patients while admitted.  Stretching exercises done.  Pt attended group, minimal participation, pt did the stretching exercises and listened to others share but did not have any verbal contribution to the group, pt sat quietly.

## 2013-08-07 NOTE — [facility] Group Notes (Signed)
[facility] LCSW Group Therapy  08/07/2013 1:49 PM  Type of Therapy:  Group Therapy  Participation Level:  Active  Participation Quality:  Attentive  Affect:  Irritable  Cognitive:  Alert and Oriented  Insight:  Improving  Engagement in Therapy:  Engaged  Modes of Intervention:  Discussion, Education, Exploration, Socialization and Support  Summary of Progress/Problems: MHA Speaker came to talk about his personal journey with substance abuse and addiction. The pt processed ways by which to relate to the speaker. MHA speaker provided handouts and educational information pertaining to groups and services offered by the [facility]. Nike was attentive and engaged throughout today's therapy group. She presented with irritable mood and labile affect. Loy listened as speaker shared his experiences with MI and SA.    Smart, Ripley Bogosian 08/07/2013, 1:49 PM

## 2013-08-10 NOTE — Progress Notes (Signed)
Patient Discharge Instructions:  After Visit Summary (AVS):   Faxed to:  08/10/13 Discharge Summary Note:   Faxed to:  08/10/13 Psychiatric Admission Assessment Note:   Faxed to:  08/10/13 Suicide Risk Assessment - Discharge Assessment:   Faxed to:  08/10/13 Faxed/Sent to the Next Level Care provider:  08/10/13 Faxed to [facility] @ [phone] Faxed to [facility] @ [phone]  Jerelene Redden, 08/10/2013, 2:58 PM

## 2013-11-16 ENCOUNTER — Other Ambulatory Visit: Payer: Self-pay | Admitting: Family Medicine

## 2013-11-16 DIAGNOSIS — N6325 Unspecified lump in the left breast, overlapping quadrants: Secondary | ICD-10-CM

## 2013-11-16 DIAGNOSIS — N6452 Nipple discharge: Secondary | ICD-10-CM

## 2013-11-16 DIAGNOSIS — N6320 Unspecified lump in the left breast, unspecified quadrant: Secondary | ICD-10-CM

## 2013-11-16 DIAGNOSIS — Z803 Family history of malignant neoplasm of breast: Secondary | ICD-10-CM

## 2013-11-19 ENCOUNTER — Ambulatory Visit
Admission: RE | Admit: 2013-11-19 | Discharge: 2013-11-19 | Disposition: A | Discharge status: Home / Self Care | Payer: PRIVATE HEALTH INSURANCE | Source: Ambulatory Visit | Attending: Family Medicine | Admitting: Family Medicine

## 2013-11-19 ENCOUNTER — Ambulatory Visit
Admission: RE | Admit: 2013-11-19 | Discharge: 2013-11-19 | Disposition: A | Discharge status: Home / Self Care | Payer: Self-pay | Source: Ambulatory Visit | Attending: Family Medicine | Admitting: Family Medicine

## 2013-11-19 DIAGNOSIS — N6452 Nipple discharge: Secondary | ICD-10-CM

## 2013-11-19 DIAGNOSIS — N6320 Unspecified lump in the left breast, unspecified quadrant: Secondary | ICD-10-CM

## 2013-11-19 DIAGNOSIS — Z803 Family history of malignant neoplasm of breast: Secondary | ICD-10-CM

## 2013-11-19 DIAGNOSIS — N6325 Unspecified lump in the left breast, overlapping quadrants: Secondary | ICD-10-CM

## 2013-11-26 ENCOUNTER — Other Ambulatory Visit: Payer: Self-pay

## 2013-12-11 ENCOUNTER — Encounter (INDEPENDENT_AMBULATORY_CARE_PROVIDER_SITE_OTHER): Payer: Self-pay | Admitting: Surgery

## 2013-12-24 ENCOUNTER — Encounter (INDEPENDENT_AMBULATORY_CARE_PROVIDER_SITE_OTHER): Payer: Self-pay | Admitting: Surgery

## 2014-03-05 ENCOUNTER — Encounter (INDEPENDENT_AMBULATORY_CARE_PROVIDER_SITE_OTHER): Payer: Self-pay | Admitting: Surgery

## 2014-03-05 ENCOUNTER — Ambulatory Visit (INDEPENDENT_AMBULATORY_CARE_PROVIDER_SITE_OTHER): Payer: Self-pay | Admitting: Surgery

## 2014-03-05 VITALS — BP 130/76 | HR 77 | Temp 98.0°F | Ht 66.0 in | Wt 165.0 lb

## 2014-03-05 DIAGNOSIS — N61 Mastitis without abscess: Secondary | ICD-10-CM

## 2014-03-05 DIAGNOSIS — N611 Abscess of the breast and nipple: Secondary | ICD-10-CM

## 2014-03-05 NOTE — Progress Notes (Signed)
Subjective:     Patient ID: Heidi FieldValerie A Williamson, female   DOB: 18-Jan-1971, 43 y.o.   MRN: [id number]  HPI She again presents with a recurrent left breast abscess. Performed excision of a chronic left breast abscess in 2012. I loosely reapproximated the skin at that time. She did well for approximately 3 months Then reports that she Startedhaving chronic recurrent infections With the drainage again from the left breast. She had an ultrasound earlier this year which still showed a chronic fluid collection approximately 1.3 cm in size in the left breast. She is currently on antibiotics  Review of Systems     Objective:   Physical Exam On exam, the left breast incision is currently well healed with no erythema and no fluctuance or tenderness    Assessment:     Chronic recurrent left breast abscess     Plan:     Wide excision of this area is recommended in order to hopefully prevent the ongoing infections. This time the wound would be left open with wound packing until it heals. Again, I strongly encouraged her to quit smoking and I believe this is a contributing factor to her recurrent infections. I again discussed the risks which include recurrence. Surgery will be scheduled

## 2014-03-07 ENCOUNTER — Other Ambulatory Visit (INDEPENDENT_AMBULATORY_CARE_PROVIDER_SITE_OTHER): Payer: Self-pay | Admitting: Surgery

## 2014-03-14 ENCOUNTER — Other Ambulatory Visit (HOSPITAL_COMMUNITY): Payer: Self-pay

## 2014-03-19 ENCOUNTER — Ambulatory Visit (HOSPITAL_COMMUNITY): Admission: RE | Admit: 2014-03-19 | Payer: Self-pay | Source: Ambulatory Visit | Admitting: Surgery

## 2014-03-19 ENCOUNTER — Encounter (HOSPITAL_COMMUNITY): Admission: RE | Payer: Self-pay | Source: Ambulatory Visit

## 2014-03-19 SURGERY — BREAST BIOPSY
Anesthesia: General | Laterality: Left

## 2014-03-26 ENCOUNTER — Encounter (INDEPENDENT_AMBULATORY_CARE_PROVIDER_SITE_OTHER): Payer: Self-pay | Admitting: Surgery

## 2017-04-05 DIAGNOSIS — F3342 Major depressive disorder, recurrent, in full remission: Secondary | ICD-10-CM | POA: Diagnosis not present

## 2017-04-05 DIAGNOSIS — F1020 Alcohol dependence, uncomplicated: Secondary | ICD-10-CM | POA: Diagnosis not present

## 2017-04-05 DIAGNOSIS — F4001 Agoraphobia with panic disorder: Secondary | ICD-10-CM | POA: Diagnosis not present

## 2017-11-15 DIAGNOSIS — F1020 Alcohol dependence, uncomplicated: Secondary | ICD-10-CM | POA: Diagnosis not present

## 2017-11-15 DIAGNOSIS — F3341 Major depressive disorder, recurrent, in partial remission: Secondary | ICD-10-CM | POA: Diagnosis not present

## 2017-11-15 DIAGNOSIS — F4001 Agoraphobia with panic disorder: Secondary | ICD-10-CM | POA: Diagnosis not present

## 2017-12-14 ENCOUNTER — Emergency Department (HOSPITAL_COMMUNITY): Payer: BLUE CROSS/BLUE SHIELD

## 2017-12-14 ENCOUNTER — Emergency Department (HOSPITAL_COMMUNITY)
Admission: EM | Admit: 2017-12-14 | Discharge: 2017-12-14 | Disposition: A | Discharge status: Home / Self Care | Payer: BLUE CROSS/BLUE SHIELD | Attending: Emergency Medicine | Admitting: Emergency Medicine

## 2017-12-14 DIAGNOSIS — F17210 Nicotine dependence, cigarettes, uncomplicated: Secondary | ICD-10-CM | POA: Diagnosis not present

## 2017-12-14 DIAGNOSIS — J111 Influenza due to unidentified influenza virus with other respiratory manifestations: Secondary | ICD-10-CM | POA: Diagnosis not present

## 2017-12-14 DIAGNOSIS — R0602 Shortness of breath: Secondary | ICD-10-CM | POA: Diagnosis not present

## 2017-12-14 DIAGNOSIS — Z79899 Other long term (current) drug therapy: Secondary | ICD-10-CM | POA: Insufficient documentation

## 2017-12-14 DIAGNOSIS — R062 Wheezing: Secondary | ICD-10-CM | POA: Diagnosis not present

## 2017-12-14 DIAGNOSIS — R05 Cough: Secondary | ICD-10-CM | POA: Diagnosis not present

## 2017-12-14 DIAGNOSIS — R069 Unspecified abnormalities of breathing: Secondary | ICD-10-CM | POA: Diagnosis not present

## 2017-12-14 LAB — CBC WITH DIFFERENTIAL/PLATELET
Basophils Absolute: 0 10*3/uL (ref 0.0–0.1)
Basophils Relative: 0 %
Eosinophils Absolute: 0 10*3/uL (ref 0.0–0.7)
Eosinophils Relative: 0 %
HCT: 42.2 % (ref 36.0–46.0)
Hemoglobin: 14.6 g/dL (ref 12.0–15.0)
Lymphocytes Relative: 5 %
Lymphs Abs: 0.3 10*3/uL — ABNORMAL LOW (ref 0.7–4.0)
MCH: 32.6 pg (ref 26.0–34.0)
MCHC: 34.6 g/dL (ref 30.0–36.0)
MCV: 94.2 fL (ref 78.0–100.0)
Monocytes Absolute: 0.9 10*3/uL (ref 0.1–1.0)
Monocytes Relative: 14 %
Neutro Abs: 5.5 10*3/uL (ref 1.7–7.7)
Neutrophils Relative %: 81 %
Platelets: 163 10*3/uL (ref 150–400)
RBC: 4.48 MIL/uL (ref 3.87–5.11)
RDW: 12.8 % (ref 11.5–15.5)
WBC: 6.7 10*3/uL (ref 4.0–10.5)

## 2017-12-14 LAB — BASIC METABOLIC PANEL
Anion gap: 9 (ref 5–15)
BUN: <5 mg/dL — ABNORMAL LOW (ref 6–20)
CO2: 26 mmol/L (ref 22–32)
Calcium: 8.9 mg/dL (ref 8.9–10.3)
Chloride: 100 mmol/L — ABNORMAL LOW (ref 101–111)
Creatinine, Ser: 0.77 mg/dL (ref 0.44–1.00)
GFR calc Af Amer: >60 mL/min (ref >60)
GFR calc non Af Amer: >60 mL/min (ref >60)
Glucose, Bld: 108 mg/dL — ABNORMAL HIGH (ref 65–99)
Potassium: 4 mmol/L (ref 3.5–5.1)
Sodium: 135 mmol/L (ref 135–145)

## 2017-12-14 LAB — INFLUENZA PANEL BY PCR (TYPE A & B)
Influenza A By PCR: POSITIVE — AB
Influenza B By PCR: NEGATIVE

## 2017-12-14 MED ORDER — ALBUTEROL (5 MG/ML) CONTINUOUS INHALATION SOLN
10.0000 mg/h | INHALATION_SOLUTION | Freq: Once | RESPIRATORY_TRACT | Status: AC
  Administered 2017-12-14: 10 mg/h via RESPIRATORY_TRACT
  Filled 2017-12-14: qty 20

## 2017-12-14 MED ORDER — PREDNISONE 50 MG PO TABS: 50.0000 mg | ORAL_TABLET | Freq: Every day | ORAL | 0 refills | Status: DC

## 2017-12-14 MED ORDER — OSELTAMIVIR PHOSPHATE 75 MG PO CAPS: 75.0000 mg | ORAL_CAPSULE | Freq: Two times a day (BID) | ORAL | 0 refills | Status: DC

## 2017-12-14 MED ORDER — IPRATROPIUM BROMIDE 0.02 % IN SOLN
0.5000 mg | Freq: Once | RESPIRATORY_TRACT | Status: AC
  Administered 2017-12-14: 0.5 mg via RESPIRATORY_TRACT
  Filled 2017-12-14: qty 2.5

## 2017-12-14 MED ORDER — NAPROXEN 500 MG PO TABS: 500.0000 mg | ORAL_TABLET | Freq: Two times a day (BID) | ORAL | 0 refills | Status: DC

## 2017-12-14 MED ORDER — ALBUTEROL SULFATE HFA 108 (90 BASE) MCG/ACT IN AERS
1.0000 | INHALATION_SPRAY | Freq: Four times a day (QID) | RESPIRATORY_TRACT | Status: DC | PRN
  Administered 2017-12-14: 2 via RESPIRATORY_TRACT
  Filled 2017-12-14: qty 6.7

## 2017-12-14 NOTE — ED Provider Notes (Signed)
Patient was initially seen by Dr. Rhunette CroftNanavati.  Please see his note.  Patient presented with respiratory flu symptoms.  Patient does have a positive flu test.  Vitals are otherwise stable.  Plan on discharge home with prescription for Tamiflu.  On my exam, the patient is now breathing easily.  No wheezing noted.  I discussed the findings with the patient.  She is comfortable with the plan.  We also discussed trying to quit smoking   Linwood DibblesKnapp, Prairie Stenberg, MD 12/14/17 1054

## 2017-12-14 NOTE — Discharge Instructions (Signed)
Follow-up with your primary care doctor, make sure to drink plenty of fluids and take the medications as prescribed to help with the flu symptoms.  Return as needed for worsening symptoms

## 2017-12-14 NOTE — ED Triage Notes (Addendum)
Patient BIB EMS for flu like symptoms and SOB since yesterday w/o fever. Patient has hx of chronic bronchitis.

## 2017-12-22 NOTE — ED Provider Notes (Addendum)
[facility]-EMERGENCY DEPT Provider Note   CSN: [id number] Arrival date & time: 12/14/17  0537     History   Chief Complaint Chief Complaint  Patient presents with  . flu like sx  . Cough    HPI Heidi Williamson is a 47 y.o. female.  HPI 47 year old female comes in with chief complaint of flulike symptoms that started yesterday.  Patient denies any fevers, however she is having cough, congestion, chills, body aches. Pt has been sick x 2 days. Pt has hx of bipolar disorder.  Past Medical History:  Diagnosis Date  . Berger's disease   . Bipolar 1 disorder   . Breast abscess   . GERD (gastroesophageal reflux disease) 10-05-11   not a problem  . Hyperpotassemia   . Substance abuse     Patient Active Problem List   Diagnosis Date Noted  . Unspecified episodic mood disorder 08/06/2013  . MDD (major depressive disorder), recurrent episode, moderate (HCC) 08/02/2013  . Breast abscess 10/04/2011    Past Surgical History:  Procedure Laterality Date  . BREAST LUMPECTOMY  10/06/2011   Procedure: LUMPECTOMY;  Surgeon: Shelly Rubensteinouglas A Blackman, MD;  Location: [facility] ORS;  Service: General;  Laterality: Left;  Excision of Left Breast Abscess  . CERVICAL CONE BIOPSY  10-05-11   ?1996  . WISDOM TOOTH EXTRACTION  1988    OB History    No data available       Home Medications    Prior to Admission medications   Medication Sig Start Date End Date Taking? Authorizing Provider  fluvoxaMINE (LUVOX) 100 MG tablet Take 100 mg by mouth at bedtime.   Yes [provider]  propranolol (INDERAL) 20 MG tablet Take 20-40 mg by mouth as directed. Take 1 tablet twice daily and 2 tablets at bedtime   Yes [provider]  aspirin EC 81 MG tablet Take 1 tablet (81 mg total) by mouth daily. For heart health Patient not taking: Reported on 12/14/2017 08/07/13   Armandina StammerNwoko, Agnes I, NP  calcium-vitamin D (OSCAL WITH D) 500-200 MG-UNIT per tablet Take 1 tablet by mouth daily  with breakfast. For bone health Patient not taking: Reported on 12/14/2017 08/07/13   Armandina StammerNwoko, Agnes I, NP  citalopram (CELEXA) 40 MG tablet Take 1 tablet (40 mg total) by mouth at bedtime. For depression Patient not taking: Reported on 12/14/2017 08/07/13   Armandina StammerNwoko, Agnes I, NP  ibuprofen (ADVIL,MOTRIN) 200 MG tablet Take 1 tablet (200 mg total) by mouth 2 (two) times daily. For pain Patient not taking: Reported on 12/14/2017 08/07/13   Armandina StammerNwoko, Agnes I, NP  Magnesium 250 MG TABS Take 2 tablets (500 mg total) by mouth daily. For acid indigestion Patient not taking: Reported on 12/14/2017 08/07/13   Armandina StammerNwoko, Agnes I, NP  Multiple Vitamins-Minerals (MULTIVITAMINS THER. W/MINERALS) TABS tablet Take 1 tablet by mouth daily. For low vitamin Patient not taking: Reported on 12/14/2017 08/07/13   Armandina StammerNwoko, Agnes I, NP  naproxen (NAPROSYN) 500 MG tablet Take 1 tablet (500 mg total) by mouth 2 (two) times daily. 12/14/17   Linwood DibblesKnapp, Jon, MD  neomycin-bacitracin-polymyxin (NEOSPORIN) OINT Apply 1 application topically 2 (two) times daily. For wound care Patient not taking: Reported on 12/14/2017 08/07/13   Armandina StammerNwoko, Agnes I, NP  omeprazole (PRILOSEC) 20 MG capsule Take 1 capsule (20 mg total) by mouth daily. For acid reflux Patient not taking: Reported on 12/14/2017 08/07/13   Armandina StammerNwoko, Agnes I, NP  oseltamivir (TAMIFLU) 75 MG capsule Take 1 capsule (  75 mg total) by mouth 2 (two) times daily. 12/14/17   Linwood Dibbles, MD  predniSONE (DELTASONE) 50 MG tablet Take 1 tablet (50 mg total) by mouth daily. 12/14/17   Linwood Dibbles, MD  traZODone (DESYREL) 50 MG tablet Take 1 tablet (50 mg total) by mouth at bedtime and may repeat dose one time if needed. Patient not taking: Reported on 12/14/2017 08/07/13   Armandina Stammer I, NP    Family History No family history on file.  Social History Social History   Tobacco Use  . Smoking status: Current Every Day Smoker    Packs/day: 1.50    Years: 15.00    Pack years: 22.50    Types: Cigarettes  . Smokeless tobacco:  Never Used  Substance Use Topics  . Alcohol use: Yes    Alcohol/week: 1.2 oz    Types: 2 Cans of beer per week    Comment: 12 pk/ weeks/ Pt. aware no alcohol or marijuana use x24 hours of surgery  . Drug use: Yes    Types: Marijuana     Allergies   Tape   Review of Systems Review of Systems  Constitutional: Positive for activity change.  Respiratory: Positive for cough and shortness of breath.   Skin: Negative for rash.  Allergic/Immunologic: Negative for immunocompromised state.  Neurological: Positive for weakness.     Physical Exam Updated Vital Signs BP (!) 148/76   Pulse 84   Temp 98.9 F (37.2 C) (Oral)   Resp 16   LMP 08/22/2017   SpO2 98%   Physical Exam  Constitutional: She is oriented to person, place, and time. She appears well-developed.  HENT:  Head: Normocephalic and atraumatic.  Eyes: EOM are normal.  Neck: Normal range of motion. Neck supple.  Cardiovascular: Normal rate.  Pulmonary/Chest: Effort normal. She has wheezes.  Abdominal: Bowel sounds are normal.  Neurological: She is alert and oriented to person, place, and time.  Skin: Skin is warm and dry.  Nursing note and vitals reviewed.    ED Treatments / Results  Labs (all labs ordered are listed, but only abnormal results are displayed) Labs Reviewed  BASIC METABOLIC PANEL - Abnormal; Notable for the following components:      Result Value   Chloride 100 (*)    Glucose, Bld 108 (*)    BUN <5 (*)    All other components within normal limits  CBC WITH DIFFERENTIAL/PLATELET - Abnormal; Notable for the following components:   Lymphs Abs 0.3 (*)    All other components within normal limits  INFLUENZA PANEL BY PCR (TYPE A & B) - Abnormal; Notable for the following components:   Influenza A By PCR POSITIVE (*)    All other components within normal limits    EKG  EKG Interpretation None       Radiology No results found.  Procedures Procedures (including critical care  time)  Medications Ordered in ED Medications  albuterol (PROVENTIL,VENTOLIN) solution continuous neb (10 mg/hr Nebulization Given 12/14/17 0727)  ipratropium (ATROVENT) nebulizer solution 0.5 mg (0.5 mg Nebulization Given 12/14/17 0727)     Initial Impression / Assessment and Plan / ED Course  I have reviewed the triage vital signs and the nursing notes.  Pertinent labs & imaging results that were available during my care of the patient were reviewed by me and considered in my medical decision making (see chart for details).     47 year old comes in with flulike illness.  Flu swab was ordered along with chest  x-ray and basic labs, with initial concerns high for influenza. Results +. Pt has bronchitis, which has flaired up a bit, but she is stable. Results from the ER workup discussed with the patient face to face and all questions answered to the best of my ability. Stable for d/c.  Final Clinical Impressions(s) / ED Diagnoses   Final diagnoses:  Influenza    ED Discharge Orders        Ordered    oseltamivir (TAMIFLU) 75 MG capsule  2 times daily     12/14/17 1044    naproxen (NAPROSYN) 500 MG tablet  2 times daily     12/14/17 1044    predniSONE (DELTASONE) 50 MG tablet  Daily     12/14/17 1055       Derwood Kaplan, MD 12/22/17 1941    Derwood Kaplan, MD 12/29/17 1529

## 2018-08-01 DIAGNOSIS — F4001 Agoraphobia with panic disorder: Secondary | ICD-10-CM | POA: Diagnosis not present

## 2018-08-01 DIAGNOSIS — F3342 Major depressive disorder, recurrent, in full remission: Secondary | ICD-10-CM | POA: Diagnosis not present

## 2018-11-20 DIAGNOSIS — Z131 Encounter for screening for diabetes mellitus: Secondary | ICD-10-CM | POA: Diagnosis not present

## 2018-11-20 DIAGNOSIS — Z23 Encounter for immunization: Secondary | ICD-10-CM | POA: Diagnosis not present

## 2018-11-20 DIAGNOSIS — Z0000 Encounter for general adult medical examination without abnormal findings: Secondary | ICD-10-CM | POA: Diagnosis not present

## 2018-11-20 DIAGNOSIS — Z13220 Encounter for screening for lipoid disorders: Secondary | ICD-10-CM | POA: Diagnosis not present

## 2018-11-27 ENCOUNTER — Other Ambulatory Visit: Payer: Self-pay | Admitting: Physician Assistant

## 2018-11-27 DIAGNOSIS — Z1231 Encounter for screening mammogram for malignant neoplasm of breast: Secondary | ICD-10-CM

## 2018-12-25 ENCOUNTER — Ambulatory Visit
Admission: RE | Admit: 2018-12-25 | Discharge: 2018-12-25 | Disposition: A | Discharge status: Home / Self Care | Payer: BLUE CROSS/BLUE SHIELD | Source: Ambulatory Visit | Attending: Physician Assistant | Admitting: Physician Assistant

## 2018-12-25 DIAGNOSIS — Z1231 Encounter for screening mammogram for malignant neoplasm of breast: Secondary | ICD-10-CM | POA: Diagnosis not present

## 2019-02-07 DIAGNOSIS — F4001 Agoraphobia with panic disorder: Secondary | ICD-10-CM | POA: Diagnosis not present

## 2019-02-07 DIAGNOSIS — F3342 Major depressive disorder, recurrent, in full remission: Secondary | ICD-10-CM | POA: Diagnosis not present

## 2019-08-09 DIAGNOSIS — F3342 Major depressive disorder, recurrent, in full remission: Secondary | ICD-10-CM | POA: Diagnosis not present

## 2019-08-09 DIAGNOSIS — F4001 Agoraphobia with panic disorder: Secondary | ICD-10-CM | POA: Diagnosis not present

## 2020-01-29 DIAGNOSIS — E559 Vitamin D deficiency, unspecified: Secondary | ICD-10-CM | POA: Diagnosis not present

## 2020-01-29 DIAGNOSIS — Z13220 Encounter for screening for lipoid disorders: Secondary | ICD-10-CM | POA: Diagnosis not present

## 2020-01-29 DIAGNOSIS — Z23 Encounter for immunization: Secondary | ICD-10-CM | POA: Diagnosis not present

## 2020-01-29 DIAGNOSIS — Z131 Encounter for screening for diabetes mellitus: Secondary | ICD-10-CM | POA: Diagnosis not present

## 2020-01-29 DIAGNOSIS — Z0000 Encounter for general adult medical examination without abnormal findings: Secondary | ICD-10-CM | POA: Diagnosis not present

## 2020-02-05 DIAGNOSIS — F3342 Major depressive disorder, recurrent, in full remission: Secondary | ICD-10-CM | POA: Diagnosis not present

## 2020-02-07 ENCOUNTER — Other Ambulatory Visit: Payer: Self-pay | Admitting: Nurse Practitioner

## 2020-02-14 ENCOUNTER — Other Ambulatory Visit: Payer: Self-pay | Admitting: Nurse Practitioner

## 2020-02-14 DIAGNOSIS — N6002 Solitary cyst of left breast: Secondary | ICD-10-CM

## 2020-02-28 ENCOUNTER — Ambulatory Visit
Admission: RE | Admit: 2020-02-28 | Discharge: 2020-02-28 | Disposition: A | Discharge status: Home / Self Care | Payer: BLUE CROSS/BLUE SHIELD | Source: Ambulatory Visit | Attending: Nurse Practitioner | Admitting: Nurse Practitioner

## 2020-02-28 ENCOUNTER — Other Ambulatory Visit: Payer: Self-pay

## 2020-02-28 DIAGNOSIS — N6489 Other specified disorders of breast: Secondary | ICD-10-CM | POA: Diagnosis not present

## 2020-02-28 DIAGNOSIS — N6002 Solitary cyst of left breast: Secondary | ICD-10-CM

## 2020-02-28 DIAGNOSIS — R928 Other abnormal and inconclusive findings on diagnostic imaging of breast: Secondary | ICD-10-CM | POA: Diagnosis not present

## 2020-02-28 DIAGNOSIS — N61 Mastitis without abscess: Secondary | ICD-10-CM | POA: Diagnosis not present

## 2020-02-29 DIAGNOSIS — N6002 Solitary cyst of left breast: Secondary | ICD-10-CM | POA: Diagnosis not present

## 2020-04-15 ENCOUNTER — Other Ambulatory Visit: Payer: Self-pay | Admitting: Surgery

## 2020-04-15 DIAGNOSIS — N649 Disorder of breast, unspecified: Secondary | ICD-10-CM | POA: Diagnosis not present

## 2020-04-15 DIAGNOSIS — N611 Abscess of the breast and nipple: Secondary | ICD-10-CM | POA: Diagnosis not present

## 2020-04-28 ENCOUNTER — Other Ambulatory Visit: Payer: Self-pay

## 2020-04-28 ENCOUNTER — Encounter (HOSPITAL_BASED_OUTPATIENT_CLINIC_OR_DEPARTMENT_OTHER): Payer: Self-pay | Admitting: Surgery

## 2020-04-30 MED ORDER — ENSURE PRE-SURGERY PO LIQD: 296.0000 mL | Freq: Once | ORAL | Status: DC

## 2020-04-30 NOTE — Progress Notes (Signed)

## 2020-05-02 ENCOUNTER — Other Ambulatory Visit (HOSPITAL_COMMUNITY)
Admission: RE | Admit: 2020-05-02 | Discharge: 2020-05-02 | Disposition: A | Discharge status: Home / Self Care | Payer: BC Managed Care – PPO | Source: Ambulatory Visit | Attending: Surgery | Admitting: Surgery

## 2020-05-02 DIAGNOSIS — Z01812 Encounter for preprocedural laboratory examination: Secondary | ICD-10-CM | POA: Diagnosis not present

## 2020-05-02 DIAGNOSIS — Z20822 Contact with and (suspected) exposure to covid-19: Secondary | ICD-10-CM | POA: Diagnosis not present

## 2020-05-02 LAB — SARS CORONAVIRUS 2 (TAT 6-24 HRS): SARS Coronavirus 2: NEGATIVE

## 2020-05-05 NOTE — H&P (Signed)
   Heidi Williamson Appointment: 04/15/2020 9:10 AM Location: [facility] Patient #: [id number] DOB: Jan 20, 1971 Divorced / Language: [place] / Race: White Female   History of Present Illness (Greer Koeppen A. Magnus Ivan MD; 04/15/2020 9:20 AM) The patient is a 49 year old female who presents with a breast abscess. She is here for a follow-up visit regarding her chronic left breast problems  I performed excision of a left breast abscess in 2012 on her. It is been intermittently recurring. I saw her last in 2015. Most recently she started having drainage again from a small area. Mammogram and ultrasound were unremarkable except for findings of inflamed mammary gland. Currently she is off antibiotics and has no complaints. She is still smoking significantly   Allergies (Chemira Jones, CMA; 04/15/2020 9:02 AM) Adhesive Tape  Rash.  Medication History Doristine Devoid, CMA; 04/15/2020 9:02 AM) Belsomra (20MG  Tablet, Oral) Active. fluvoxaMINE Maleate (100MG  Tablet, Oral) Active. Propranolol HCl (20MG  Tablet, Oral) Active. Sulfamethoxazole-Trimethoprim (800-160MG  Tablet, Oral) Active. Medications Reconciled  Vitals (Chemira Jones CMA; 04/15/2020 9:02 AM) 04/15/2020 9:01 AM Weight: 219.2 lb Height: 67in Body Surface Area: 2.1 m Body Mass Index: 34.33 kg/m  Temp.: 97.3F  Pulse: 83 (Regular)  BP: 140/78(Sitting, Left Arm, Standard)       Physical Exam (Gohan Collister A. MD; 04/15/2020 9:20 AM) The physical exam findings are as follows: Note: On examination of the left breast, there is excoriation of the nipple and a small healed wound along the scar at the medial aspect of the left areola.  She also has a 1 cm skin lesion on the left breast near the sternum which is dark in appearance and raised    Assessment & Plan (Penn Grissett A. 06/15/2020 MD; 04/15/2020 9:22 AM) LEFT BREAST ABSCESS (N61.1) SKIN LESION OF BREAST (N64.9) Impression: I reviewed her previous mammogram,  ultrasound, and surgical history. Reexcision of the chronic left breast abscess is recommended and she is here to proceed with surgery. I discussed the diagnosis with her in detail. I would also recommend excision of the 1 cm abnormal skin lesion of the left breast for histologic evaluation to rule out malignancy. I discussed the risks of surgery which includes but is not limited to bleeding, infection, recurrent abscesses especially with her smoking history, findings of malignancy, the need for further procedures, postoperative recovery, etc. She understands and wishes to proceed with surgery which will be scheduled. This patient encounter took 20 minutes today to perform the following: take history, perform exam, review outside records, interpret imaging, counsel the patient on their diagnosis and document encounter, findings & plan in the EHR

## 2020-05-06 ENCOUNTER — Encounter (HOSPITAL_BASED_OUTPATIENT_CLINIC_OR_DEPARTMENT_OTHER): Payer: Self-pay | Admitting: Surgery

## 2020-05-06 ENCOUNTER — Ambulatory Visit (HOSPITAL_BASED_OUTPATIENT_CLINIC_OR_DEPARTMENT_OTHER): Payer: BC Managed Care – PPO | Admitting: Certified Registered"

## 2020-05-06 ENCOUNTER — Other Ambulatory Visit: Payer: Self-pay

## 2020-05-06 ENCOUNTER — Ambulatory Visit (HOSPITAL_BASED_OUTPATIENT_CLINIC_OR_DEPARTMENT_OTHER)
Admission: RE | Admit: 2020-05-06 | Discharge: 2020-05-06 | Disposition: A | Discharge status: Home / Self Care | Payer: BC Managed Care – PPO | Attending: Surgery | Admitting: Surgery

## 2020-05-06 ENCOUNTER — Encounter (HOSPITAL_BASED_OUTPATIENT_CLINIC_OR_DEPARTMENT_OTHER)
Admission: RE | Disposition: A | Discharge status: Home / Self Care | Payer: Self-pay | Source: Home / Self Care | Attending: Surgery

## 2020-05-06 DIAGNOSIS — M7989 Other specified soft tissue disorders: Secondary | ICD-10-CM | POA: Diagnosis not present

## 2020-05-06 DIAGNOSIS — N649 Disorder of breast, unspecified: Secondary | ICD-10-CM | POA: Diagnosis not present

## 2020-05-06 DIAGNOSIS — N6042 Mammary duct ectasia of left breast: Secondary | ICD-10-CM | POA: Diagnosis not present

## 2020-05-06 DIAGNOSIS — N611 Abscess of the breast and nipple: Secondary | ICD-10-CM | POA: Insufficient documentation

## 2020-05-06 DIAGNOSIS — L089 Local infection of the skin and subcutaneous tissue, unspecified: Secondary | ICD-10-CM | POA: Diagnosis not present

## 2020-05-06 DIAGNOSIS — K219 Gastro-esophageal reflux disease without esophagitis: Secondary | ICD-10-CM | POA: Diagnosis not present

## 2020-05-06 HISTORY — PX: EXCISION OF BREAST LESION: SHX6676

## 2020-05-06 HISTORY — DX: Depression, unspecified: F32.A

## 2020-05-06 HISTORY — DX: Anxiety disorder, unspecified: F41.9

## 2020-05-06 HISTORY — PX: BREAST LUMPECTOMY: SHX2

## 2020-05-06 SURGERY — BREAST LUMPECTOMY
Anesthesia: General | Site: Breast | Laterality: Left

## 2020-05-06 MED ORDER — OXYCODONE HCL 5 MG PO TABS
ORAL_TABLET | ORAL | Status: AC
  Filled 2020-05-06: qty 1

## 2020-05-06 MED ORDER — FENTANYL CITRATE (PF) 100 MCG/2ML IJ SOLN
INTRAMUSCULAR | Status: DC | PRN
  Administered 2020-05-06: 50 ug via INTRAVENOUS
  Administered 2020-05-06: 100 ug via INTRAVENOUS

## 2020-05-06 MED ORDER — FENTANYL CITRATE (PF) 100 MCG/2ML IJ SOLN
INTRAMUSCULAR | Status: AC
  Filled 2020-05-06: qty 2

## 2020-05-06 MED ORDER — LIDOCAINE HCL (CARDIAC) PF 100 MG/5ML IV SOSY
PREFILLED_SYRINGE | INTRAVENOUS | Status: DC | PRN
  Administered 2020-05-06: 100 mg via INTRAVENOUS

## 2020-05-06 MED ORDER — MIDAZOLAM HCL 2 MG/2ML IJ SOLN
INTRAMUSCULAR | Status: AC
  Filled 2020-05-06: qty 2

## 2020-05-06 MED ORDER — DEXAMETHASONE SODIUM PHOSPHATE 10 MG/ML IJ SOLN
INTRAMUSCULAR | Status: AC
  Filled 2020-05-06: qty 1

## 2020-05-06 MED ORDER — CELECOXIB 200 MG PO CAPS
400.0000 mg | ORAL_CAPSULE | ORAL | Status: AC
  Administered 2020-05-06: 400 mg via ORAL

## 2020-05-06 MED ORDER — CEFAZOLIN SODIUM-DEXTROSE 2-4 GM/100ML-% IV SOLN: 2.0000 g | INTRAVENOUS | Status: DC

## 2020-05-06 MED ORDER — GLYCOPYRROLATE 0.2 MG/ML IJ SOLN
INTRAMUSCULAR | Status: DC | PRN
Start: 2020-05-06 — End: 2020-05-06
  Administered 2020-05-06: .2 mg via INTRAVENOUS

## 2020-05-06 MED ORDER — ACETAMINOPHEN 500 MG PO TABS
ORAL_TABLET | ORAL | Status: AC
  Filled 2020-05-06: qty 2

## 2020-05-06 MED ORDER — CEFAZOLIN SODIUM-DEXTROSE 2-3 GM-%(50ML) IV SOLR
INTRAVENOUS | Status: DC | PRN
  Administered 2020-05-06: 2 g via INTRAVENOUS

## 2020-05-06 MED ORDER — MIDAZOLAM HCL 2 MG/2ML IJ SOLN
INTRAMUSCULAR | Status: DC | PRN
  Administered 2020-05-06: 2 mg via INTRAVENOUS

## 2020-05-06 MED ORDER — LACTATED RINGERS IV SOLN: INTRAVENOUS | Status: DC

## 2020-05-06 MED ORDER — OXYCODONE HCL 5 MG PO TABS
5.0000 mg | ORAL_TABLET | Freq: Once | ORAL | Status: AC | PRN
  Administered 2020-05-06: 5 mg via ORAL

## 2020-05-06 MED ORDER — GABAPENTIN 300 MG PO CAPS
ORAL_CAPSULE | ORAL | Status: AC
  Filled 2020-05-06: qty 1

## 2020-05-06 MED ORDER — LIDOCAINE 2% (20 MG/ML) 5 ML SYRINGE
INTRAMUSCULAR | Status: AC
  Filled 2020-05-06: qty 5

## 2020-05-06 MED ORDER — ONDANSETRON HCL 4 MG/2ML IJ SOLN
INTRAMUSCULAR | Status: AC
  Filled 2020-05-06: qty 2

## 2020-05-06 MED ORDER — ONDANSETRON HCL 4 MG/2ML IJ SOLN
INTRAMUSCULAR | Status: DC | PRN
  Administered 2020-05-06: 4 mg via INTRAVENOUS

## 2020-05-06 MED ORDER — OXYCODONE HCL 5 MG PO TABS: 5.0000 mg | ORAL_TABLET | Freq: Four times a day (QID) | ORAL | 0 refills | Status: DC | PRN

## 2020-05-06 MED ORDER — BUPIVACAINE HCL (PF) 0.5 % IJ SOLN
INTRAMUSCULAR | Status: DC | PRN
  Administered 2020-05-06: 20 mL

## 2020-05-06 MED ORDER — CELECOXIB 200 MG PO CAPS
ORAL_CAPSULE | ORAL | Status: AC
  Filled 2020-05-06: qty 2

## 2020-05-06 MED ORDER — PROPOFOL 10 MG/ML IV BOLUS
INTRAVENOUS | Status: AC
  Filled 2020-05-06: qty 40

## 2020-05-06 MED ORDER — CHLORHEXIDINE GLUCONATE CLOTH 2 % EX PADS: 6.0000 | MEDICATED_PAD | Freq: Once | CUTANEOUS | Status: DC

## 2020-05-06 MED ORDER — CEFAZOLIN SODIUM-DEXTROSE 2-4 GM/100ML-% IV SOLN
INTRAVENOUS | Status: AC
  Filled 2020-05-06: qty 100

## 2020-05-06 MED ORDER — HYDROMORPHONE HCL 1 MG/ML IJ SOLN: 0.2500 mg | INTRAMUSCULAR | Status: DC | PRN

## 2020-05-06 MED ORDER — AMISULPRIDE (ANTIEMETIC) 5 MG/2ML IV SOLN: 10.0000 mg | Freq: Once | INTRAVENOUS | Status: DC | PRN

## 2020-05-06 MED ORDER — ACETAMINOPHEN 500 MG PO TABS
1000.0000 mg | ORAL_TABLET | ORAL | Status: AC
  Administered 2020-05-06: 1000 mg via ORAL

## 2020-05-06 MED ORDER — GABAPENTIN 300 MG PO CAPS
300.0000 mg | ORAL_CAPSULE | ORAL | Status: AC
  Administered 2020-05-06: 300 mg via ORAL

## 2020-05-06 MED ORDER — LACTATED RINGERS IV SOLN: INTRAVENOUS | Status: DC | PRN

## 2020-05-06 MED ORDER — PROPOFOL 10 MG/ML IV BOLUS
INTRAVENOUS | Status: DC | PRN
  Administered 2020-05-06: 200 mg via INTRAVENOUS

## 2020-05-06 MED ORDER — PROMETHAZINE HCL 25 MG/ML IJ SOLN: 6.2500 mg | INTRAMUSCULAR | Status: DC | PRN

## 2020-05-06 MED ORDER — EPHEDRINE SULFATE 50 MG/ML IJ SOLN
INTRAMUSCULAR | Status: DC | PRN
  Administered 2020-05-06 (×2): 10 mg via INTRAVENOUS

## 2020-05-06 MED ORDER — MEPERIDINE HCL 25 MG/ML IJ SOLN: 6.2500 mg | INTRAMUSCULAR | Status: DC | PRN

## 2020-05-06 MED ORDER — OXYCODONE HCL 5 MG/5ML PO SOLN: 5.0000 mg | Freq: Once | ORAL | Status: AC | PRN

## 2020-05-06 SURGICAL SUPPLY — 38 items
BLADE SURG 15 STRL LF DISP TIS (BLADE) ×1 IMPLANT
BLADE SURG 15 STRL SS (BLADE) ×3
CANISTER SUCT 1200ML W/VALVE (MISCELLANEOUS) IMPLANT
CHLORAPREP W/TINT 26 (MISCELLANEOUS) ×3 IMPLANT
CLIP VESOCCLUDE SM WIDE 6/CT (CLIP) IMPLANT
COVER BACK TABLE 60X90IN (DRAPES) ×3 IMPLANT
COVER MAYO STAND STRL (DRAPES) ×3 IMPLANT
COVER WAND RF STERILE (DRAPES) IMPLANT
DECANTER SPIKE VIAL GLASS SM (MISCELLANEOUS) IMPLANT
DERMABOND ADVANCED (GAUZE/BANDAGES/DRESSINGS) ×2
DERMABOND ADVANCED .7 DNX12 (GAUZE/BANDAGES/DRESSINGS) ×1 IMPLANT
DRAPE LAPAROTOMY 100X72 PEDS (DRAPES) ×3 IMPLANT
DRAPE UTILITY XL STRL (DRAPES) ×3 IMPLANT
ELECT REM PT RETURN 9FT ADLT (ELECTROSURGICAL) ×3
ELECTRODE REM PT RTRN 9FT ADLT (ELECTROSURGICAL) ×1 IMPLANT
GAUZE SPONGE 4X4 12PLY STRL LF (GAUZE/BANDAGES/DRESSINGS) ×3 IMPLANT
GLOVE SURG SIGNA 7.5 PF LTX (GLOVE) ×3 IMPLANT
GOWN STRL REUS W/ TWL LRG LVL3 (GOWN DISPOSABLE) ×1 IMPLANT
GOWN STRL REUS W/ TWL XL LVL3 (GOWN DISPOSABLE) ×1 IMPLANT
GOWN STRL REUS W/TWL LRG LVL3 (GOWN DISPOSABLE) ×3
GOWN STRL REUS W/TWL XL LVL3 (GOWN DISPOSABLE) ×3
KIT MARKER MARGIN INK (KITS) ×3 IMPLANT
NEEDLE HYPO 25X1 1.5 SAFETY (NEEDLE) ×3 IMPLANT
NS IRRIG 1000ML POUR BTL (IV SOLUTION) ×3 IMPLANT
PENCIL SMOKE EVACUATOR (MISCELLANEOUS) ×3 IMPLANT
SET BASIN DAY SURGERY F.S. (CUSTOM PROCEDURE TRAY) ×3 IMPLANT
SLEEVE SCD COMPRESS KNEE MED (MISCELLANEOUS) ×3 IMPLANT
SPONGE LAP 4X18 RFD (DISPOSABLE) ×3 IMPLANT
SUT MNCRL AB 4-0 PS2 18 (SUTURE) ×3 IMPLANT
SUT SILK 2 0 SH (SUTURE) IMPLANT
SUT VIC AB 3-0 SH 27 (SUTURE) ×3
SUT VIC AB 3-0 SH 27X BRD (SUTURE) ×1 IMPLANT
SYR CONTROL 10ML LL (SYRINGE) ×3 IMPLANT
TOWEL GREEN STERILE FF (TOWEL DISPOSABLE) ×3 IMPLANT
TRAY FAXITRON CT DISP (TRAY / TRAY PROCEDURE) IMPLANT
TUBE CONNECTING 20'X1/4 (TUBING)
TUBE CONNECTING 20X1/4 (TUBING) IMPLANT
YANKAUER SUCT BULB TIP NO VENT (SUCTIONS) IMPLANT

## 2020-05-06 NOTE — Interval H&P Note (Signed)
History and Physical Interval Note: no change in H and P  05/06/2020 11:43 AM  Heidi Williamson  has presented today for surgery, with the diagnosis of CHRONIC LEFT BREAST ABSCESS, ABNORMAL LEFT BREAST LESION.  The various methods of treatment have been discussed with the patient and family. After consideration of risks, benefits and other options for treatment, the patient has consented to  Procedure(s) with comments: LEFT BREAST LUMPECTOMY AND EXCISION OF LEFT BREAST ABNORMAL SKIN LESION (Left) - LMA as a surgical intervention.  The patient's history has been reviewed, patient examined, no change in status, stable for surgery.  I have reviewed the patient's chart and labs.  Questions were answered to the patient's satisfaction.     [name]

## 2020-05-06 NOTE — Anesthesia Postprocedure Evaluation (Signed)
Anesthesia Post Note  Patient: Heidi Williamson  Procedure(s) Performed: LEFT BREAST LUMPECTOMY   (Left Breast) EXCISION OF BREAST LESION (Left Breast)     Patient location during evaluation: PACU Anesthesia Type: General Level of consciousness: awake and alert Pain management: pain level controlled Vital Signs Assessment: post-procedure vital signs reviewed and stable Respiratory status: spontaneous breathing, nonlabored ventilation and respiratory function stable Cardiovascular status: blood pressure returned to baseline and stable Postop Assessment: no apparent nausea or vomiting Anesthetic complications: no   No complications documented.  Last Vitals:  Vitals:   05/06/20 1315 05/06/20 1331  BP: (!) 103/57 111/73  Pulse: 66 63  Resp: 14 18  Temp:  36.5 C  SpO2: 96% 96%    Last Pain:  Vitals:   05/06/20 1348  TempSrc:   PainSc: 6                  Lowella Curb

## 2020-05-06 NOTE — Anesthesia Procedure Notes (Signed)
Procedure Name: LMA Insertion Performed by: Kairah Leoni M, CRNA Pre-anesthesia Checklist: Patient identified, Emergency Drugs available, Suction available and Patient being monitored Patient Re-evaluated:Patient Re-evaluated prior to induction Oxygen Delivery Method: Circle system utilized Preoxygenation: Pre-oxygenation with 100% oxygen Induction Type: IV induction Ventilation: Mask ventilation without difficulty LMA: LMA inserted LMA Size: 4.0 Tube type: Oral Number of attempts: 1 Airway Equipment and Method: Bite block Placement Confirmation: positive ETCO2 and CO2 detector Tube secured with: Tape Dental Injury: Teeth and Oropharynx as per pre-operative assessment        

## 2020-05-06 NOTE — Transfer of Care (Signed)
Immediate Anesthesia Transfer of Care Note  Patient: Heidi Williamson  Procedure(s) Performed: LEFT BREAST LUMPECTOMY   (Left Breast) EXCISION OF BREAST LESION (Left Breast)  Patient Location: PACU  Anesthesia Type:General  Level of Consciousness: awake, alert  and oriented  Airway & Oxygen Therapy: Patient Spontanous Breathing and Patient connected to face mask oxygen  Post-op Assessment: Report given to RN and Post -op Vital signs reviewed and stable  Post vital signs: Reviewed and stable  Last Vitals:  Vitals Value Taken Time  BP    Temp    Pulse 52 05/06/20 1235  Resp    SpO2 100 % 05/06/20 1235  Vitals shown include unvalidated device data.  Last Pain:  Vitals:   05/06/20 1101  PainSc: 0-No pain         Complications: No complications documented.

## 2020-05-06 NOTE — Op Note (Addendum)
LEFT BREAST LUMPECTOMY  , EXCISION OF BREAST LESION  Procedure Note  CELLA CAPPELLO 05/06/2020   Pre-op Diagnosis: CHRONIC LEFT BREAST ABSCESS, ABNORMAL LEFT BREAST LESION     Post-op Diagnosis: same  Procedure(s): LEFT BREAST LUMPECTOMY   EXCISION OF ABNORMAL SKIN LESION LEFT BREAST ( 1 CM)  Surgeon(s): [name], MD  Anesthesia: General  Staff:  Circulator: McDonough-Hughes, Maceo Pro, RN Scrub Person: Maryan Rued, RN  Estimated Blood Loss: Minimal               Specimens: sent to path  Indications: This is a 49 year old female who presents with chronic recurrent breast abscesses.  She has had previous lumpectomies in the past.  She also has a abnormal skin lesion on the skin of the left breast near the sternum.  The decision has been made to proceed with excision of the skin lesion as well as a lumpectomy and potential central duct excision  Procedure: Patient brought to operating identifies correct patient.  She is placed upon the operating table and general anesthesia was induced.  Her left breast was prepped and draped in usual sterile fashion.  There was a 1 cm small skin lesion on the medial breast superiorly toward the sternum.  I anesthetized skin of this area with Marcaine and made elliptical incision excising the skin lesion in its entirety.  It was sent to pathology for evaluation.  I then closed the small incision with a 3-0 Vicryl suture. I next anesthetized the skin at the medial edge of the areola of the left breast with Marcaine incorporating the previous scar and drainage site.  There is currently no active infection evident.  A poor elliptical incision with a scalpel removing this old scar and then take this down to the breast tissue.  There are multiple dilated ducts but no purulence.  There was old debris blocking off the nipple which I excised.  I excised the ducts underneath the nipple as well going back toward the initial incision forming a lumpectomy  of this tissue.  Again there was no purulence or mass identified.  This tissue was sent to pathology for evaluation as well.  Hemostasis was achieved with cautery.  I anesthetized the area again circumferentially with Marcaine.  I then closed subcutaneous tissue with interrupted 3-0 Vicryl sutures and closed skin with running 4-0 Monocryl.  Dermabond was then applied to both incisions.  The patient tolerated the procedure well.  All the counts were correct at the end of the procedure.  The patient was then extubated in the operating room and taken in stable addition to the recovery room.          [name]   Date: 05/06/2020  Time: 12:33 PM

## 2020-05-06 NOTE — Anesthesia Preprocedure Evaluation (Signed)
Anesthesia Evaluation  Patient identified by MRN, date of birth, ID band Patient awake    Reviewed: Allergy & Precautions, H&P , NPO status , Patient's Chart, lab work & pertinent test results  Airway Mallampati: II  TM Distance: >3 FB Neck ROM: full    Dental no notable dental hx. (+) Dental Advidsory Given, Teeth Intact, Caps,    Pulmonary Current Smoker and Patient abstained from smoking.,    Pulmonary exam normal breath sounds clear to auscultation       Cardiovascular Exercise Tolerance: Good negative cardio ROS Normal cardiovascular exam Rhythm:regular Rate:Normal     Neuro/Psych PSYCHIATRIC DISORDERS Anxiety Depression Bipolar Disorder negative neurological ROS     GI/Hepatic negative GI ROS, Neg liver ROS, neg GERD  ,  Endo/Other  negative endocrine ROS  Renal/GU negative Renal ROS  negative genitourinary   Musculoskeletal   Abdominal (+) + obese,   Peds  Hematology negative hematology ROS (+)   Anesthesia Other Findings   Reproductive/Obstetrics negative OB ROS                             Anesthesia Physical  Anesthesia Plan  ASA: II  Anesthesia Plan: General   Post-op Pain Management:    Induction: Intravenous  PONV Risk Score and Plan: 2 and Ondansetron, Midazolam and Treatment may vary due to age or medical condition  Airway Management Planned: LMA  Additional Equipment:   Intra-op Plan:   Post-operative Plan: Extubation in OR  Informed Consent: I have reviewed the patients History and Physical, chart, labs and discussed the procedure including the risks, benefits and alternatives for the proposed anesthesia with the patient or authorized representative who has indicated his/her understanding and acceptance.     Dental Advisory Given  Plan Discussed with: CRNA and Surgeon  Anesthesia Plan Comments:         Anesthesia Quick Evaluation

## 2020-05-06 NOTE — Discharge Instructions (Signed)
Next dose of Tylenol and Ibuprofen at 5:15 PM  No Oxycodone until 7:45PM.   [facility] Office Phone Number [phone]  BREAST BIOPSY/ PARTIAL MASTECTOMY: POST OP INSTRUCTIONS  Always review your discharge instruction sheet given to you by the facility where your surgery was performed.  IF YOU HAVE DISABILITY OR FAMILY LEAVE FORMS, YOU MUST BRING THEM TO THE OFFICE FOR PROCESSING.  DO NOT GIVE THEM TO YOUR DOCTOR.  1. A prescription for pain medication may be given to you upon discharge.  Take your pain medication as prescribed, if needed.  If narcotic pain medicine is not needed, then you may take acetaminophen (Tylenol) or ibuprofen (Advil) as needed. 2. Take your usually prescribed medications unless otherwise directed 3. If you need a refill on your pain medication, please contact your pharmacy.  They will contact our office to request authorization.  Prescriptions will not be filled after 5pm or on week-ends. 4. You should eat very light the first 24 hours after surgery, such as soup, crackers, pudding, etc.  Resume your normal diet the day after surgery. 5. Most patients will experience some swelling and bruising in the breast.  Ice packs and a good support bra will help.  Swelling and bruising can take several days to resolve.  6. It is common to experience some constipation if taking pain medication after surgery.  Increasing fluid intake and taking a stool softener will usually help or prevent this problem from occurring.  A mild laxative (Milk of Magnesia or Miralax) should be taken according to package directions if there are no bowel movements after 48 hours. 7. Unless discharge instructions indicate otherwise, you may remove your bandages 24-48 hours after surgery, and you may shower at that time.  You may have steri-strips (small skin tapes) in place directly over the incision.  These strips should be left on the skin for 7-10 days.  If your surgeon used skin glue  on the incision, you may shower in 24 hours.  The glue will flake off over the next 2-3 weeks.  Any sutures or staples will be removed at the office during your follow-up visit. 8. ACTIVITIES:  You may resume regular daily activities (gradually increasing) beginning the next day.  Wearing a good support bra or sports bra minimizes pain and swelling.  You may have sexual intercourse when it is comfortable. a. You may drive when you no longer are taking prescription pain medication, you can comfortably wear a seatbelt, and you can safely maneuver your car and apply brakes. b. RETURN TO WORK:  ______________________________________________________________________________________ 9. You should see your doctor in the office for a follow-up appointment approximately two weeks after your surgery.  Your doctor's nurse will typically make your follow-up appointment when she calls you with your pathology report.  Expect your pathology report 2-3 business days after your surgery.  You may call to check if you do not hear from [place] after three days. 10. OTHER INSTRUCTIONS:OK TO SHOWER STARTING TOMORROW 11. TRY NOT TO SMOKE 12. ICE PACK, TYLENOL, AND IBUPROFEN ALSO FOR PAIN _______________________________________________________________________________________________ _____________________________________________________________________________________________________________________________________ _____________________________________________________________________________________________________________________________________ _____________________________________________________________________________________________________________________________________  WHEN TO CALL YOUR DOCTOR: 1. Fever over 101.0 2. Nausea and/or vomiting. 3. Extreme swelling or bruising. 4. Continued bleeding from incision. 5. Increased pain, redness, or drainage from the incision.  The clinic staff is available to answer your  questions during regular business hours.  Please don't hesitate to call and ask to speak to one of the nurses for clinical concerns.  If you have  a medical emergency, go to the nearest emergency room or call 911.  A surgeon from [facility] is always on call at the hospital.  For further questions, please visit [email or web address]    Post Anesthesia Home Care Instructions  Activity: Get plenty of rest for the remainder of the day. A responsible individual must stay with you for 24 hours following the procedure.  For the next 24 hours, DO NOT: -Drive a car -Advertising copywriter -Drink alcoholic beverages -Take any medication unless instructed by your physician -Make any legal decisions or sign important papers.  Meals: Start with liquid foods such as gelatin or soup. Progress to regular foods as tolerated. Avoid greasy, spicy, heavy foods. If nausea and/or vomiting occur, drink only clear liquids until the nausea and/or vomiting subsides. Call your physician if vomiting continues.  Special Instructions/Symptoms: Your throat may feel dry or sore from the anesthesia or the breathing tube placed in your throat during surgery. If this causes discomfort, gargle with warm salt water. The discomfort should disappear within 24 hours.  If you had a scopolamine patch placed behind your ear for the management of post- operative nausea and/or vomiting:  1. The medication in the patch is effective for 72 hours, after which it should be removed.  Wrap patch in a tissue and discard in the trash. Wash hands thoroughly with soap and water. 2. You may remove the patch earlier than 72 hours if you experience unpleasant side effects which may include dry mouth, dizziness or visual disturbances. 3. Avoid touching the patch. Wash your hands with soap and water after contact with the patch.

## 2020-05-07 ENCOUNTER — Encounter (HOSPITAL_BASED_OUTPATIENT_CLINIC_OR_DEPARTMENT_OTHER): Payer: Self-pay | Admitting: Surgery

## 2020-05-07 LAB — SURGICAL PATHOLOGY

## 2020-08-04 DIAGNOSIS — F4001 Agoraphobia with panic disorder: Secondary | ICD-10-CM | POA: Diagnosis not present

## 2020-08-04 DIAGNOSIS — F331 Major depressive disorder, recurrent, moderate: Secondary | ICD-10-CM | POA: Diagnosis not present

## 2020-08-14 DIAGNOSIS — F331 Major depressive disorder, recurrent, moderate: Secondary | ICD-10-CM | POA: Diagnosis not present

## 2020-08-14 DIAGNOSIS — F4001 Agoraphobia with panic disorder: Secondary | ICD-10-CM | POA: Diagnosis not present

## 2020-09-25 DIAGNOSIS — F331 Major depressive disorder, recurrent, moderate: Secondary | ICD-10-CM | POA: Diagnosis not present

## 2020-09-25 DIAGNOSIS — F4001 Agoraphobia with panic disorder: Secondary | ICD-10-CM | POA: Diagnosis not present

## 2020-11-11 DIAGNOSIS — M25572 Pain in left ankle and joints of left foot: Secondary | ICD-10-CM | POA: Diagnosis not present

## 2020-11-11 DIAGNOSIS — N951 Menopausal and female climacteric states: Secondary | ICD-10-CM | POA: Diagnosis not present

## 2021-01-14 ENCOUNTER — Ambulatory Visit: Payer: BC Managed Care – PPO | Admitting: Obstetrics & Gynecology

## 2021-01-14 ENCOUNTER — Encounter: Payer: Self-pay | Admitting: Obstetrics & Gynecology

## 2021-01-14 ENCOUNTER — Other Ambulatory Visit: Payer: Self-pay

## 2021-01-14 VITALS — BP 102/69 | HR 91 | Ht 66.0 in | Wt 170.0 lb

## 2021-01-14 DIAGNOSIS — R7989 Other specified abnormal findings of blood chemistry: Secondary | ICD-10-CM

## 2021-01-14 DIAGNOSIS — R630 Anorexia: Secondary | ICD-10-CM

## 2021-01-14 DIAGNOSIS — N959 Unspecified menopausal and perimenopausal disorder: Secondary | ICD-10-CM

## 2021-01-14 DIAGNOSIS — F1010 Alcohol abuse, uncomplicated: Secondary | ICD-10-CM

## 2021-01-14 MED ORDER — SERTRALINE HCL 50 MG PO TABS
50.0000 mg | ORAL_TABLET | Freq: Every day | ORAL | 3 refills | Status: DC
Start: 2021-01-14 — End: 2021-01-28

## 2021-01-14 NOTE — Progress Notes (Signed)
GYN VISIT Patient name: Heidi Williamson MRN [id number]  Date of birth: 1971-04-13 Chief Complaint:   discuss hormones (Irritated, emotional, insecure)  History of Present Illness:   Heidi Williamson is a 50 y.o. G0P0000 Caucasian postmenopausal feamle being seen today for "mood concerns."  She reports that for quite some time she feels like she is "hormonal"  Notes irritability, quick change in moods and emotional instability.  She also reports decreased appetite.     Pt reports disconnected history- being seen at [facility].- advised that she had low thyroid and abnormal liver enzymes.  She did not feel comfortable following up with them.  Previously seen by Dr. Hyman Hopes at [facility] (2014?) and [facility]- however, she was recently dismissed from their care.  She did not go into details, but notes that she had an outburst at their office.  Prior to this, she did feel like the therapist there was helping to make progress.  She does report being on medications for her mood in the past, but could not recall what- the last she could remember was Luvox and Propanolol.  Upon further questioning reports alcohol abuse- currently about 3 tall boys/per day  Depression screen [facility] 2/9 01/14/2021  Decreased Interest 3  Down, Depressed, Hopeless 1  PHQ - 2 Score 4  Altered sleeping 3  Tired, decreased energy 3  Change in appetite 3  Feeling bad or failure about yourself  3  Trouble concentrating 1  Moving slowly or fidgety/restless 2  Suicidal thoughts 0  PHQ-9 Score 19    Patient's last menstrual period was 08/08/2018. The current method of family planning is menopausal.  Last pap 2020. Results were: outside facility Review of Systems:   Pertinent items are noted in HPI Denies fever/chills, dizziness, headaches, visual disturbances, fatigue, shortness of breath, chest pain, abdominal pain, vomiting, abnormal vaginal discharge/itching/odor/irritation, problems with periods, bowel movements,  urination, or intercourse unless otherwise stated above.  Pertinent History Reviewed:  Reviewed past medical,surgical, social, obstetrical and family history.  Reviewed problem list, medications and allergies. Physical Assessment:   Vitals:   01/14/21 1438  BP: 102/69  Pulse: 91  Weight: 170 lb (77.1 kg)  Height: 5\' 6"  (1.676 m)  Body mass index is 27.44 kg/m.       Physical Examination:   General appearance: alert, well appearing, and in no distress  Mental status: alert, oriented to person, place, and time  Skin: warm & dry   Neck: thyroid not enlarged  Cardiovascular: normal heart rate noted  Respiratory: normal respiratory effort, no distress  Extremities: no edema   Chaperone: N/A    No results found for this or any previous visit (from the past 24 hour(s)).  Assessment & Plan:  1) Menopausal disorder with  Pt desires HRT- long discussion with patient regarding pros/cons.  Due to abnormal LFTs- would advise completion of work up with PCP prior to starting this medication -pt to bring records from prior PCP -plan to restart SSRI- Rx for zoloft sent in -f/u in 60mos  2) h/o Bipolar disorder -encouraged pt to find new therapist/pscyhiatrist to re-establish care  3) Decreased appetite -per pt low thyroid levels and desires repeat testing today -further management pending results  Meds:  Meds ordered this encounter  Medications  . sertraline (ZOLOFT) 50 MG tablet    Sig: Take 1 tablet (50 mg total) by mouth daily.    Dispense:  30 tablet    Refill:  3    Orders Placed  This Encounter  Procedures  . Thyroid Panel With TSH    Return in about 2 months (around 03/16/2021) for medication follow up.  Myna Hidalgo, DO Attending Obstetrician & Gynecologist, [facility] for [employer], [facility]

## 2021-01-19 ENCOUNTER — Telehealth: Payer: Self-pay | Admitting: Obstetrics & Gynecology

## 2021-01-19 NOTE — Telephone Encounter (Signed)
No answer unable to leave a message

## 2021-01-19 NOTE — Telephone Encounter (Signed)
Pt returned call to Dr. Charlotta Newton, please see previous phone message

## 2021-01-20 ENCOUNTER — Other Ambulatory Visit: Payer: Self-pay | Admitting: *Deleted

## 2021-01-20 DIAGNOSIS — N959 Unspecified menopausal and perimenopausal disorder: Secondary | ICD-10-CM | POA: Diagnosis not present

## 2021-01-20 DIAGNOSIS — R630 Anorexia: Secondary | ICD-10-CM | POA: Diagnosis not present

## 2021-01-21 LAB — THYROID PANEL WITH TSH
Free Thyroxine Index: 2.3 (ref 1.2–4.9)
T3 Uptake Ratio: 42 % — ABNORMAL HIGH (ref 24–39)
T4, Total: 5.4 ug/dL (ref 4.5–12.0)
TSH: 0.89 u[IU]/mL (ref 0.450–4.500)

## 2021-01-23 ENCOUNTER — Telehealth: Payer: Self-pay

## 2021-01-23 NOTE — Telephone Encounter (Signed)
Pt calling about labs from 01/20/21.

## 2021-01-28 ENCOUNTER — Telehealth: Payer: Self-pay | Admitting: Obstetrics & Gynecology

## 2021-01-28 MED ORDER — SERTRALINE HCL 100 MG PO TABS
100.0000 mg | ORAL_TABLET | Freq: Every day | ORAL | 1 refills | Status: DC
Start: 2021-01-28 — End: 2022-02-24

## 2021-01-28 NOTE — Telephone Encounter (Signed)
PT is seeking results from blood work.

## 2021-01-28 NOTE — Telephone Encounter (Signed)
Patient called back in.  Reviewed lab results.  She feels like the zoloft is helping, but still has significant anxiety during the day.  Plan to increase to 100mg  daily, f/u as scheduled in May.

## 2021-02-02 ENCOUNTER — Other Ambulatory Visit: Payer: Self-pay | Admitting: Obstetrics & Gynecology

## 2021-02-02 DIAGNOSIS — Z1231 Encounter for screening mammogram for malignant neoplasm of breast: Secondary | ICD-10-CM

## 2021-02-23 ENCOUNTER — Telehealth: Payer: Self-pay | Admitting: Obstetrics & Gynecology

## 2021-02-23 NOTE — Telephone Encounter (Signed)
Pt is back to taking Zoloft 50 mg. She couldn't tolerate 100 mg. Wanted to let you know. JSY

## 2021-02-23 NOTE — Telephone Encounter (Signed)
Patient wanted Dr.Ozan to know that she went back to the 50mg  of Zoloft , the 100mg  wasn't agreeing with her. (per patient)..Clinical staff will follow up with patient.

## 2021-03-11 ENCOUNTER — Ambulatory Visit: Payer: BC Managed Care – PPO | Admitting: Obstetrics & Gynecology

## 2021-03-24 ENCOUNTER — Ambulatory Visit: Payer: BC Managed Care – PPO

## 2021-07-15 ENCOUNTER — Ambulatory Visit: Payer: BC Managed Care – PPO | Admitting: Dermatology

## 2021-08-20 ENCOUNTER — Ambulatory Visit
Admission: RE | Admit: 2021-08-20 | Discharge: 2021-08-20 | Disposition: A | Discharge status: Home / Self Care | Payer: No Typology Code available for payment source | Source: Ambulatory Visit | Attending: Obstetrics & Gynecology | Admitting: Obstetrics & Gynecology

## 2021-08-20 DIAGNOSIS — Z1231 Encounter for screening mammogram for malignant neoplasm of breast: Secondary | ICD-10-CM

## 2021-12-01 ENCOUNTER — Other Ambulatory Visit (HOSPITAL_COMMUNITY)
Admission: RE | Admit: 2021-12-01 | Payer: No Typology Code available for payment source | Source: Ambulatory Visit | Admitting: Obstetrics & Gynecology

## 2021-12-01 ENCOUNTER — Other Ambulatory Visit: Payer: Self-pay

## 2021-12-01 ENCOUNTER — Encounter: Payer: Self-pay | Admitting: Obstetrics & Gynecology

## 2021-12-01 ENCOUNTER — Ambulatory Visit (INDEPENDENT_AMBULATORY_CARE_PROVIDER_SITE_OTHER): Payer: BLUE CROSS/BLUE SHIELD | Admitting: Obstetrics & Gynecology

## 2021-12-01 VITALS — BP 114/70 | HR 83 | Ht 66.0 in | Wt 131.0 lb

## 2021-12-01 DIAGNOSIS — R4586 Emotional lability: Secondary | ICD-10-CM

## 2021-12-01 DIAGNOSIS — Z124 Encounter for screening for malignant neoplasm of cervix: Secondary | ICD-10-CM

## 2021-12-01 DIAGNOSIS — Z79890 Hormone replacement therapy (postmenopausal): Secondary | ICD-10-CM

## 2021-12-01 MED ORDER — ESTRADIOL 0.025 MG/24HR TD PTTW: 1.0000 | MEDICATED_PATCH | TRANSDERMAL | 12 refills | Status: DC

## 2021-12-01 MED ORDER — MEDROXYPROGESTERONE ACETATE 2.5 MG PO TABS: 2.5000 mg | ORAL_TABLET | Freq: Every day | ORAL | 4 refills | Status: DC

## 2021-12-01 NOTE — Progress Notes (Signed)
Preventive screening Patient name: Heidi Williamson MRN [id number]  Date of birth: Apr 24, 1971 Chief Complaint:   Gynecologic Exam (Having mood swings)  History of Present Illness:   Heidi Williamson is a 51 y.o. PM female being seen today for the following concerns:  -Cervical cancer screen- last completed 2020  -Hormone concerns: Seen by Dr. Toy Care started gabapentin and citalopram.  Discontinued medication; however, she has noted considerable mood swing and change in attitude.  She has mentioned HRT to Dr. Toy Care who advised follow up with [place].  Denies hot flashes or night sweats.  No acute gyn concerns  Patient's last menstrual period was 08/08/2018.  Last pap 2020.  Last mammogram: 08/2021- [facility]. [facility]. Last colonoscopy: encouraged to f/u with PCP  Depression screen [facility] 2/9 12/01/2021 01/14/2021  Decreased Interest 3 3  Down, Depressed, Hopeless 3 1  PHQ - 2 Score 6 4  Altered sleeping 3 3  Tired, decreased energy 3 3  Change in appetite 3 3  Feeling bad or failure about yourself  3 3  Trouble concentrating 3 1  Moving slowly or fidgety/restless 3 2  Suicidal thoughts 1 0  PHQ-9 Score 25 19      Review of Systems:   Pertinent items are noted in HPI Denies any headaches, blurred vision, fatigue, shortness of breath, chest pain, abdominal pain, bowel movements, urination, or intercourse unless otherwise stated above.  Pertinent History Reviewed:  Reviewed past medical,surgical, social and family history.  Reviewed problem list, medications and allergies. Physical Assessment:   Vitals:   12/01/21 1532  BP: 114/70  Pulse: 83  Weight: 131 lb (59.4 kg)  Height: 5\' 6"  (1.676 m)  Body mass index is 21.14 kg/m.        Physical Examination:   General appearance - well appearing, and in no distress  Mental status - alert, oriented to person, place, and time  Psych:  She has a normal mood and affect  Skin - warm and dry, normal color, no suspicious lesions noted  Chest - effort  normal, all lung fields clear to auscultation bilaterally  Abdomen - soft, nontender, nondistended  Pelvic - VULVA: normal appearing vulva with no masses, tenderness or lesions  VAGINA: normal appearing vagina with normal color and discharge, no lesions  CERVIX: normal appearing cervix without discharge or lesions, no CMT  Thin prep pap is done with HR HPV cotesting  UTERUS: uterus is felt to be normal size, shape, consistency and nontender   ADNEXA: No adnexal masses or tenderness noted.  Extremities:  No swelling or varicosities noted  Chaperone: Levy Pupa     Assessment & Plan:  1) Cervical cancer screening -pap collected, further management pending  2) HRT/Mood changes Reviewed [facility] discussed risk/benefit of treatment.  Reviewed shortest course for lowest dose.  Reviewed contractions: Denies prior h/o VTE, stroke, MI.  Denies liver disease.  Plan to start on low dose HRT- including E patch and low dose progesterone daily - plan to f/u in 3 mos   Meds:  Meds ordered this encounter  Medications   estradiol (VIVELLE-DOT) 0.025 MG/24HR    Sig: Place 1 patch onto the skin 2 (two) times a week.    Dispense:  8 patch    Refill:  12   medroxyPROGESTERone (PROVERA) 2.5 MG tablet    Sig: Take 1 tablet (2.5 mg total) by mouth daily.    Dispense:  90 tablet    Refill:  4    Follow-up: Return in about  3 months (around 03/01/2022) for Medication follow up (ok for video visit).   Janyth Pupa, DO Attending [place], [facility] for [employer], [place]

## 2021-12-04 ENCOUNTER — Telehealth: Payer: Self-pay | Admitting: *Deleted

## 2021-12-04 LAB — CYTOLOGY - PAP
Comment: NEGATIVE
Diagnosis: NEGATIVE
High risk HPV: NEGATIVE

## 2021-12-04 NOTE — Telephone Encounter (Signed)
Left message @ 1:44 pm letting pt know pap/HPV was negative. JSY

## 2021-12-29 ENCOUNTER — Telehealth: Payer: Self-pay | Admitting: Obstetrics & Gynecology

## 2021-12-29 DIAGNOSIS — N959 Unspecified menopausal and perimenopausal disorder: Secondary | ICD-10-CM

## 2021-12-29 DIAGNOSIS — Z79890 Hormone replacement therapy (postmenopausal): Secondary | ICD-10-CM

## 2021-12-29 DIAGNOSIS — F603 Borderline personality disorder: Secondary | ICD-10-CM

## 2021-12-29 NOTE — Telephone Encounter (Signed)
Patient is on the hormone patch and states that she rather have the pill, than patch if could send a script to [facility] [place] 

## 2021-12-30 MED ORDER — PREMPRO 0.3-1.5 MG PO TABS: 1.0000 | ORAL_TABLET | Freq: Every day | ORAL | 11 refills | Status: DC

## 2021-12-30 NOTE — Addendum Note (Signed)
Addended by: Sharon Seller on: 12/30/2021 11:34 AM   Modules accepted: Orders

## 2021-12-30 NOTE — Telephone Encounter (Signed)
Called patient to review- having problems with application of the patch and wishes to take a pill instead.  Plan to transition to combination pill- stop oral progesterone.  Pt reports issues with depression- not eating and drinking too much.  States she is struggling to find a therapist because she has trust issues and borderline personality d/o.  She has always required a therpist not medication, but feels like it has gotten worse.  Reviewed emergency contact [phone] and advised to go to [facility] services in [place].  Pt took down phone number and stated she would go.  Myna Hidalgo, DO Attending Obstetrician & Gynecologist, [facility] for [employer], [facility]

## 2022-02-24 ENCOUNTER — Ambulatory Visit: Payer: BLUE CROSS/BLUE SHIELD | Admitting: Obstetrics & Gynecology

## 2022-02-24 ENCOUNTER — Encounter: Payer: Self-pay | Admitting: Obstetrics & Gynecology

## 2022-02-24 VITALS — BP 106/60 | HR 77 | Ht 66.0 in | Wt 127.2 lb

## 2022-02-24 DIAGNOSIS — Z79890 Hormone replacement therapy (postmenopausal): Secondary | ICD-10-CM | POA: Diagnosis not present

## 2022-02-24 DIAGNOSIS — N959 Unspecified menopausal and perimenopausal disorder: Secondary | ICD-10-CM

## 2022-02-24 DIAGNOSIS — R630 Anorexia: Secondary | ICD-10-CM

## 2022-02-24 DIAGNOSIS — F603 Borderline personality disorder: Secondary | ICD-10-CM

## 2022-02-24 NOTE — Progress Notes (Signed)
? ?  GYN VISIT ?Patient name: Heidi Williamson MRN [id number]  Date of birth: January 21, 1971 ?Chief Complaint:   ?Follow-up (Has no appetite; moods are better) ? ?History of Present Illness:   ?Heidi Williamson is a 51 y.o. G0P0000 PM female being seen today for follow up regarding:  ? ?-HRT: She was started on combo-pill.  Today she notes that the pill does seemed to have helped, but she has noted decreased appetite and has struggled with weight loss.  Notes improvement of night sweats.  Denies vaginal bleeding.  Previously noted considerable mood swings and attitude changes. ? ?Reports having a lot of stress in her life.  No longer being seen by Dr. Evelene Croon and has not been able to find a new therapist or psychiatrist who takes her insurance.  ? ?Patient's last menstrual period was 08/08/2018. ? ? ?  12/01/2021  ?  3:38 PM 01/14/2021  ?  2:31 PM  ?Depression screen PHQ 2/9  ?Decreased Interest 3 3  ?Down, Depressed, Hopeless 3 1  ?PHQ - 2 Score 6 4  ?Altered sleeping 3 3  ?Tired, decreased energy 3 3  ?Change in appetite 3 3  ?Feeling bad or failure about yourself  3 3  ?Trouble concentrating 3 1  ?Moving slowly or fidgety/restless 3 2  ?Suicidal thoughts 1 0  ?PHQ-9 Score 25 19  ? ? ? ?Review of Systems:   ?Pertinent items are noted in HPI ?Denies fever/chills, dizziness, headaches, visual disturbances, fatigue, shortness of breath, chest pain, abdominal pain, vomiting,  bowel movements, urination, or intercourse unless otherwise stated above.  ?Pertinent History Reviewed:  ?Reviewed past medical,surgical, social, obstetrical and family history.  ?Reviewed problem list, medications and allergies. ?Physical Assessment:  ? ?Vitals:  ? 02/24/22 1357  ?BP: 106/60  ?Pulse: 77  ?Weight: 127 lb 3.2 oz (57.7 kg)  ?Height: 5\' 6"  (1.676 m)  ?Body mass index is 20.53 kg/m?. ? ?     Physical Examination:  ? General appearance: alert, no acute distress ? Psych: mood appropriate, normal affect ? Skin: warm & dry  ? Cardiovascular: normal  heart rate noted ? Respiratory: normal respiratory effort, no distress ?  ? ?Chaperone: N/A   ? ?Assessment & Plan:  ?1) HRT, Menopausal symptoms ?-doing well with current medication ?-weight loss since last visit of 3lbs ? ?2) Weight loss ?-pt does not think weight loss directly related to current medication but rather anxiety/depression and acute stress in her life ?-reviewed emergency contact/crisis lines and encouraged pt to reach directly back out to insurance to help her find therapist ? ? ?Return in about 1 year (around 02/25/2023) for Annual. ? ? ?02/27/2023, DO ?Attending Obstetrician & Gynecologist, Faculty Practice ?Center for Myna Hidalgo, [facility] ? ? ? ?

## 2022-02-26 ENCOUNTER — Ambulatory Visit: Payer: BLUE CROSS/BLUE SHIELD | Admitting: Obstetrics & Gynecology

## 2022-04-19 IMAGING — US US BREAST*L* LIMITED INC AXILLA
1 series · 13 of 14 positions shown · non-contrast
Comparison: Previous exams.

CLINICAL DATA: 48-year-old female with a palpable nodule on the
skin of the left nipple/areola as well as a small adjacent
additional nodule which is drained a small amount of yellow
discharge. The patient does have a history of excision of a left
subareolar abscess with recurrent subareolar left breast
abscess/infected sebaceous cyst in 0845.

EXAM:
DIGITAL DIAGNOSTIC BILATERAL MAMMOGRAM WITH CAD AND TOMO
LEFT BREAST ULTRASOUND

[Series 1: us breast*left* limited inc axilla · 0.06mm/px · 13 of 14 slices shown]
[im 1/14]
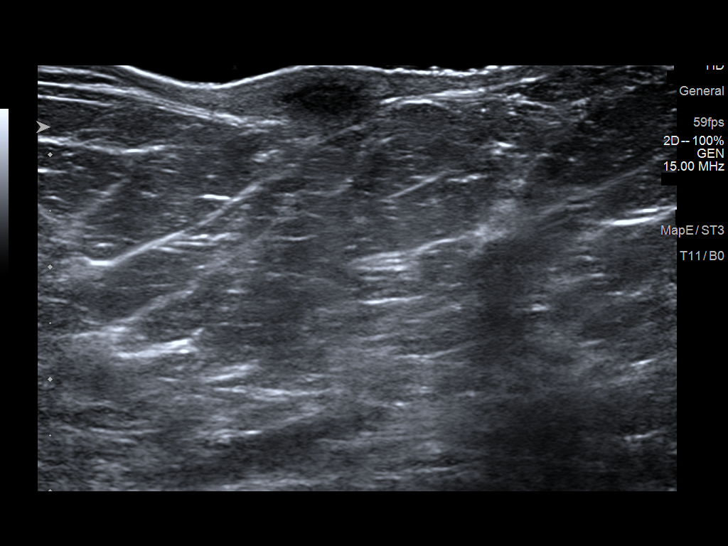
[im 2/14]
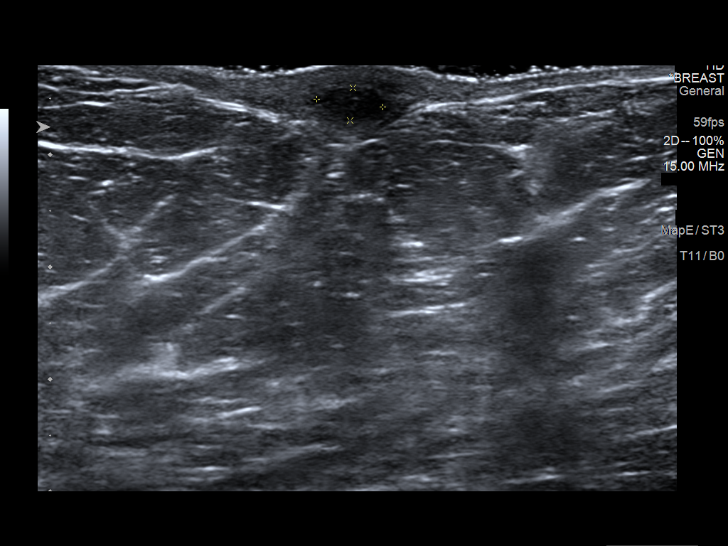
[im 3/14]
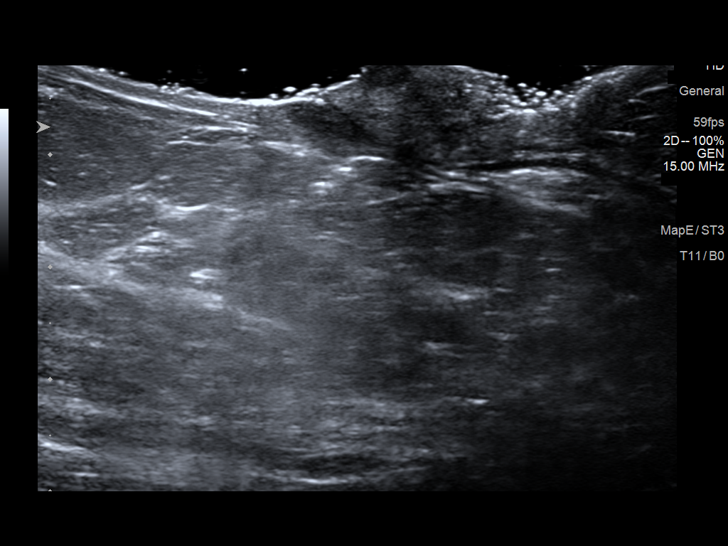
[im 4/14]
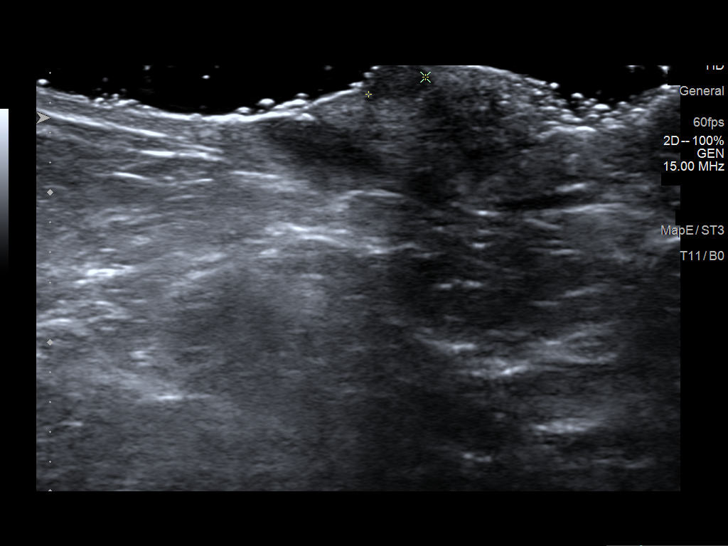
[im 5/14]
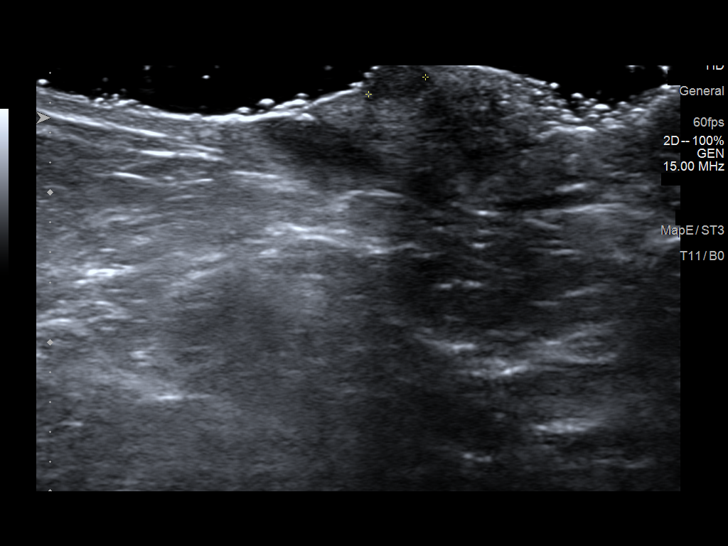
[im 6/14]
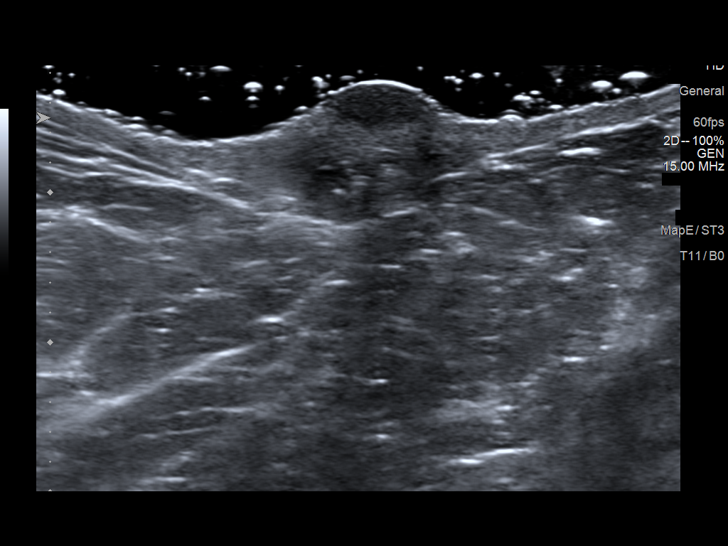
[im 8/14]
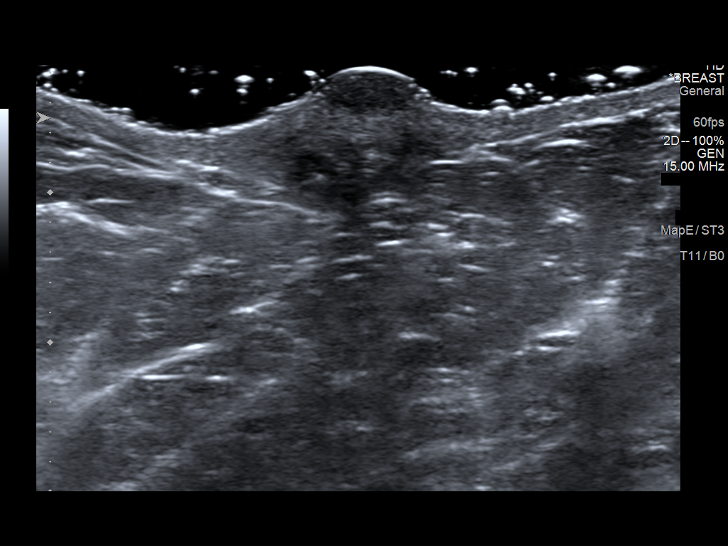
[im 9/14]
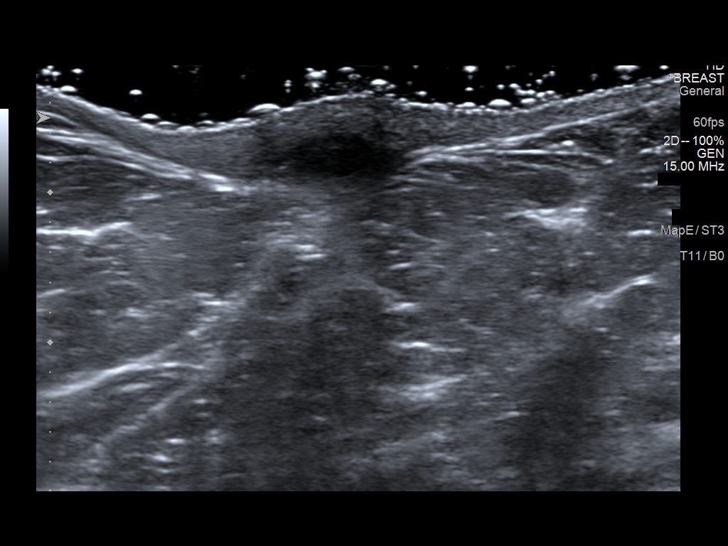
[im 10/14]
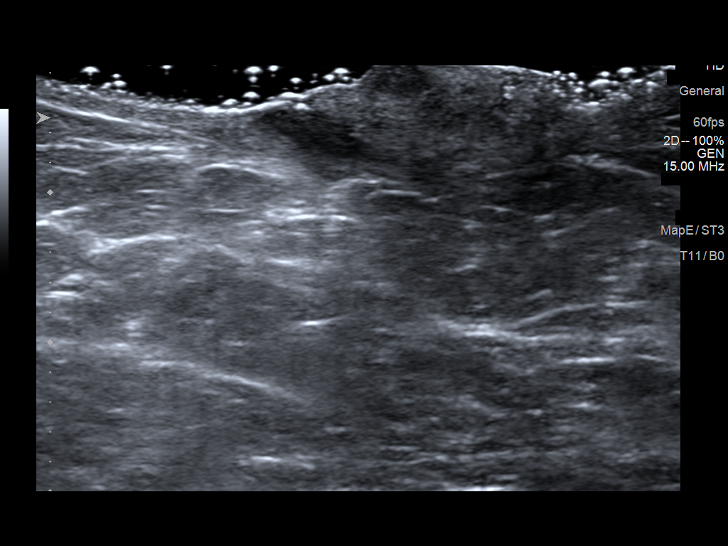
[im 11/14]
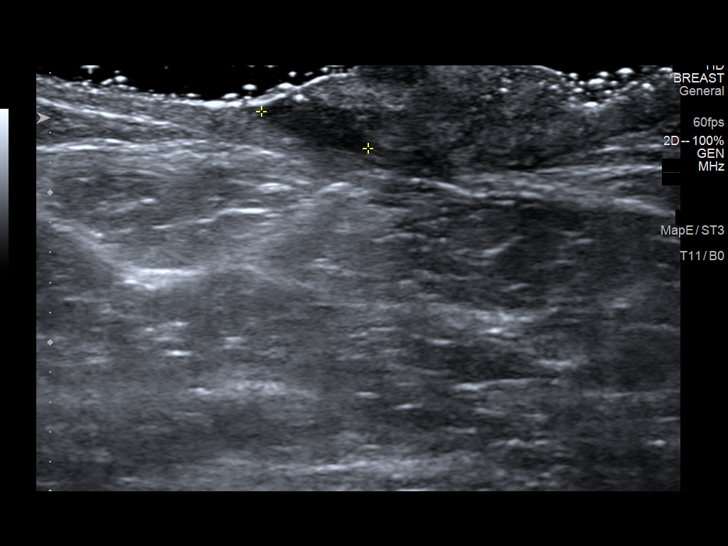
[im 12/14]
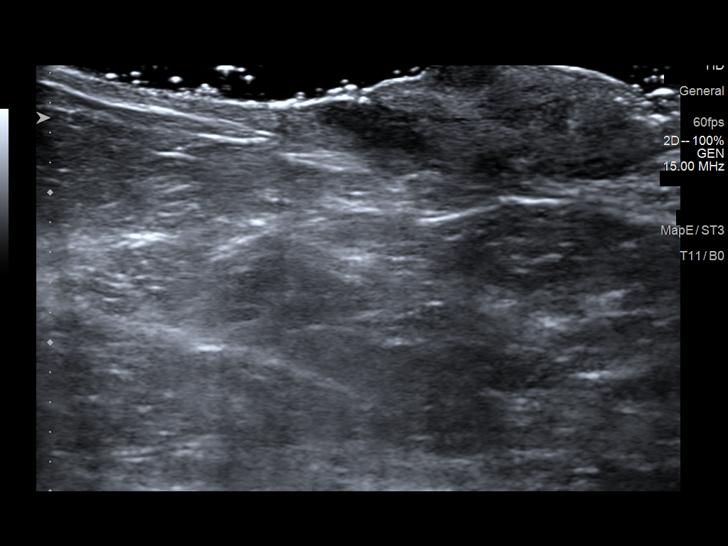
[im 13/14]
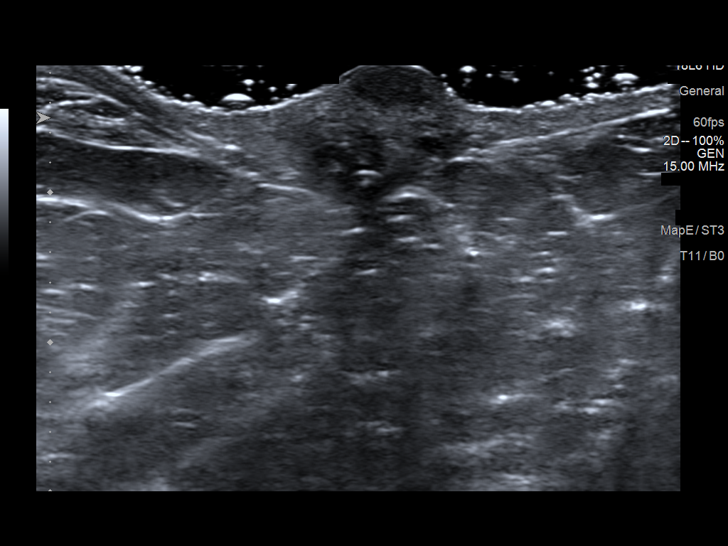
[im 14/14]
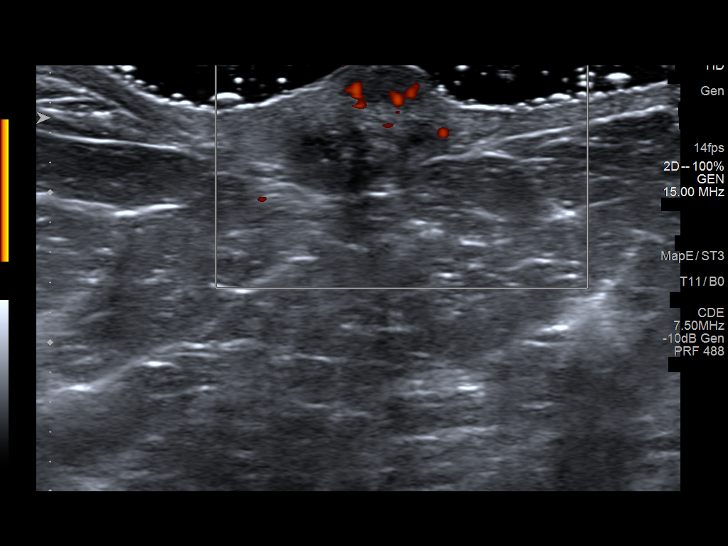

[13 of 14 positions shown; findings below may reference images not displayed]

ACR Breast Density Category b: There are scattered areas of
fibroglandular density.
FINDINGS: No suspicious masses or calcifications are seen in either breast.
Spot compression cc tomograms were performed over the area of
concern in the central/retroareolar left breast with no suspicious
abnormality seen.

Mammographic images were processed with CAD.

Physical examination reveals a red nodule the medial aspect of the
left nipple which contains a small yellow papule. There is an
additional palpable thickening in the skin adjacent to this nodule.
The patient states this area has demonstrated a small amount of
drainage. These areas are not particularly tender to palpation.
There is no surrounding redness in the skin of the breast.

Targeted ultrasound was performed. There is skin thickening with a
hypoechoic mass in the skin along the edge of the left nipple at the
9 o'clock position measuring 0.6 cm. Immediately adjacent to this
closer to the nipple there is an area of marked skin thickening
measuring up to 1.8 cm containing a small hypoechoic mass also
measuring 0.6 cm. A surgical scar is noted along the margin of the
skin thickening. Findings are most suggestive of inflamed [REDACTED]
glands.
IMPRESSION: 1. Findings are most suggestive of inflamed [REDACTED] glands
involving the medial portion of the areola of the left breast.

2.  No findings of malignancy in either breast.

RECOMMENDATION:
1. Clinical follow-up for inflamed [REDACTED] glands involving the
medial portion of the areola of the left breast. Consider a course
of antibiotics at the patient is exhibiting signs of infection. If
these small palpable [REDACTED] glands continue to be
bothersome/inflamed/infected, then recommend referral to a surgeon
for excision.

2.  Screening mammogram in one year.(Code:[id number]-[id number])

I have discussed the findings and recommendations with the patient.
If applicable, a reminder letter will be sent to the patient
regarding the next appointment.

BI-RADS CATEGORY  2: Benign.

## 2022-04-19 IMAGING — MG DIGITAL DIAGNOSTIC BILAT W/ TOMO W/ CAD
6 of 10 series · 6 of 30 positions shown · non-contrast
Comparison: Previous exams.

CLINICAL DATA: 48-year-old female with a palpable nodule on the
skin of the left nipple/areola as well as a small adjacent
additional nodule which is drained a small amount of yellow
discharge. The patient does have a history of excision of a left
subareolar abscess with recurrent subareolar left breast
abscess/infected sebaceous cyst in 0845.

EXAM:
DIGITAL DIAGNOSTIC BILATERAL MAMMOGRAM WITH CAD AND TOMO
LEFT BREAST ULTRASOUND

[L CC synth-2D (1 of 2)]
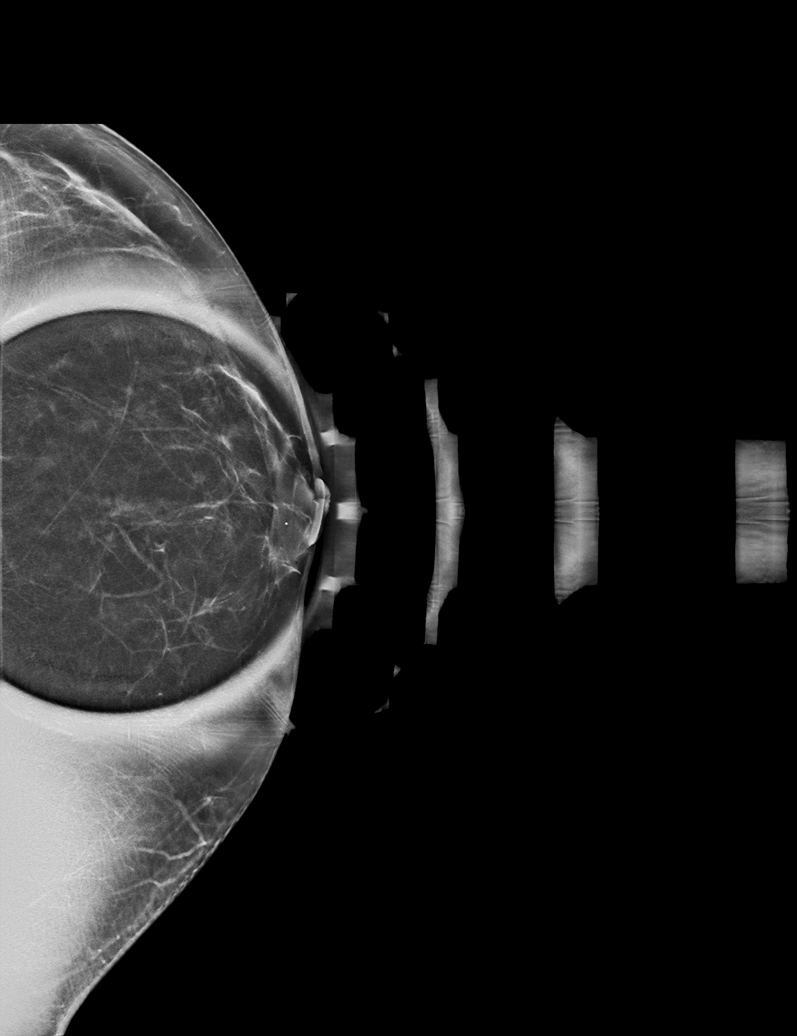

[R MLO synth-2D]
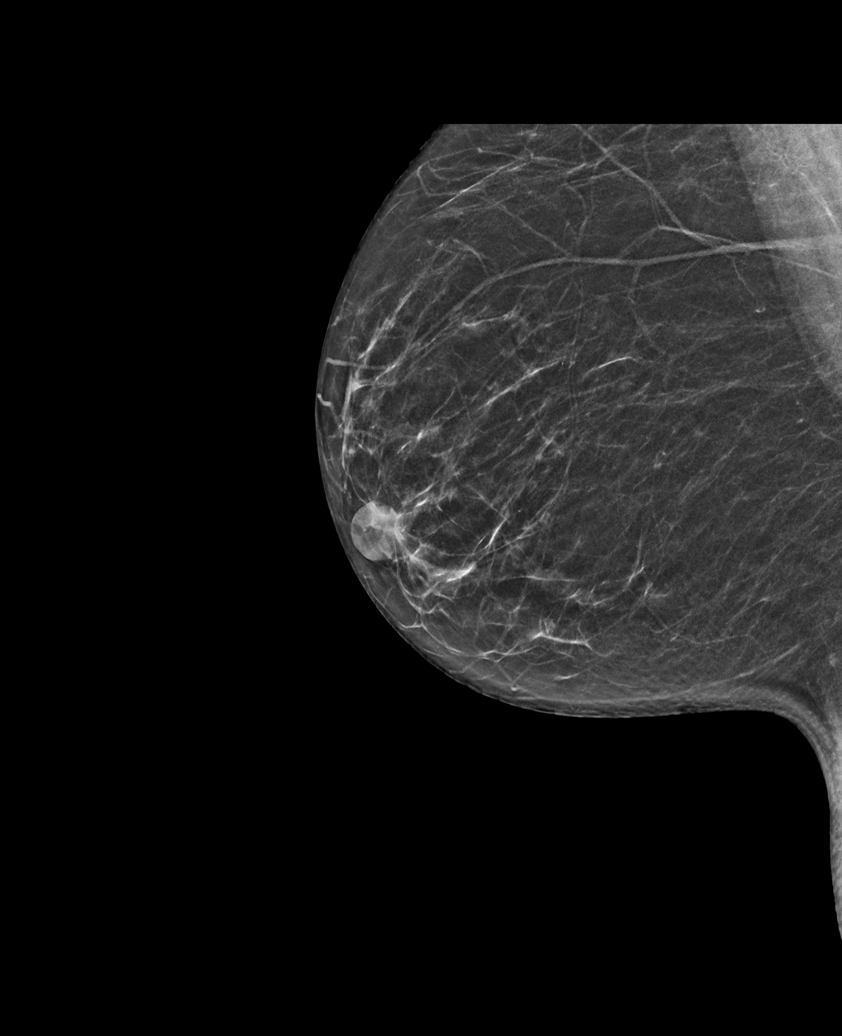

[R CC synth-2D]
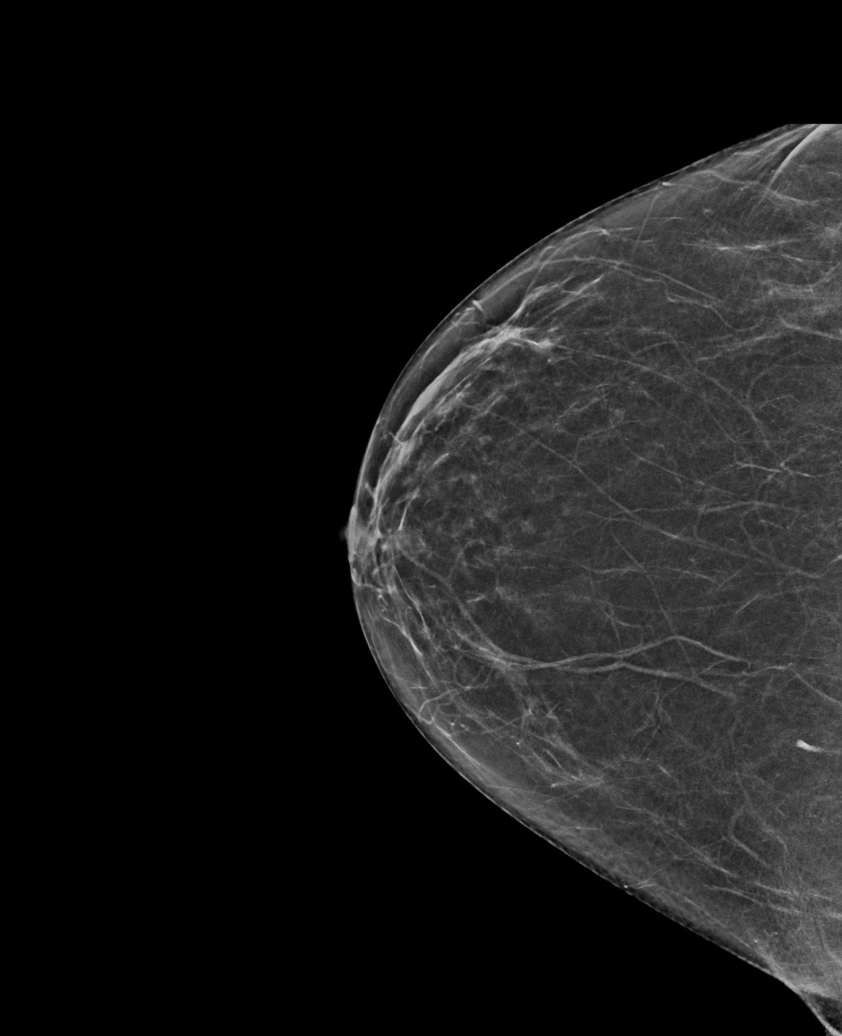

[L MLO synth-2D]
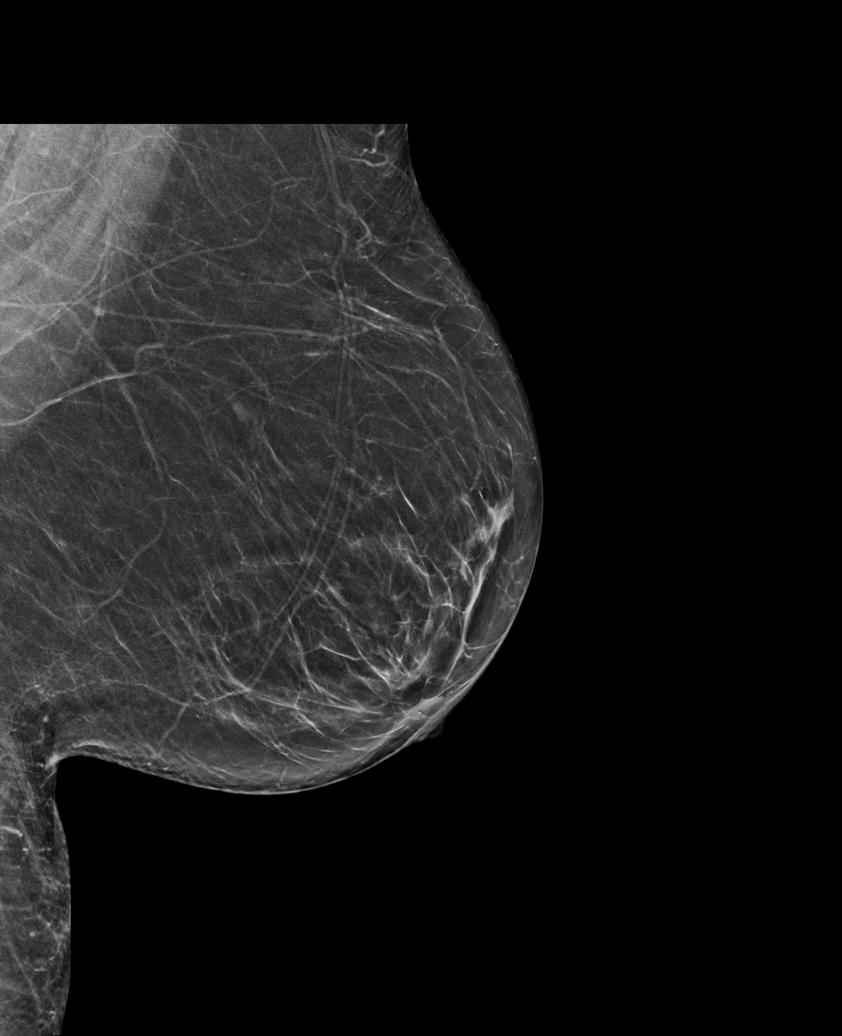

[L CC synth-2D (2 of 2)]
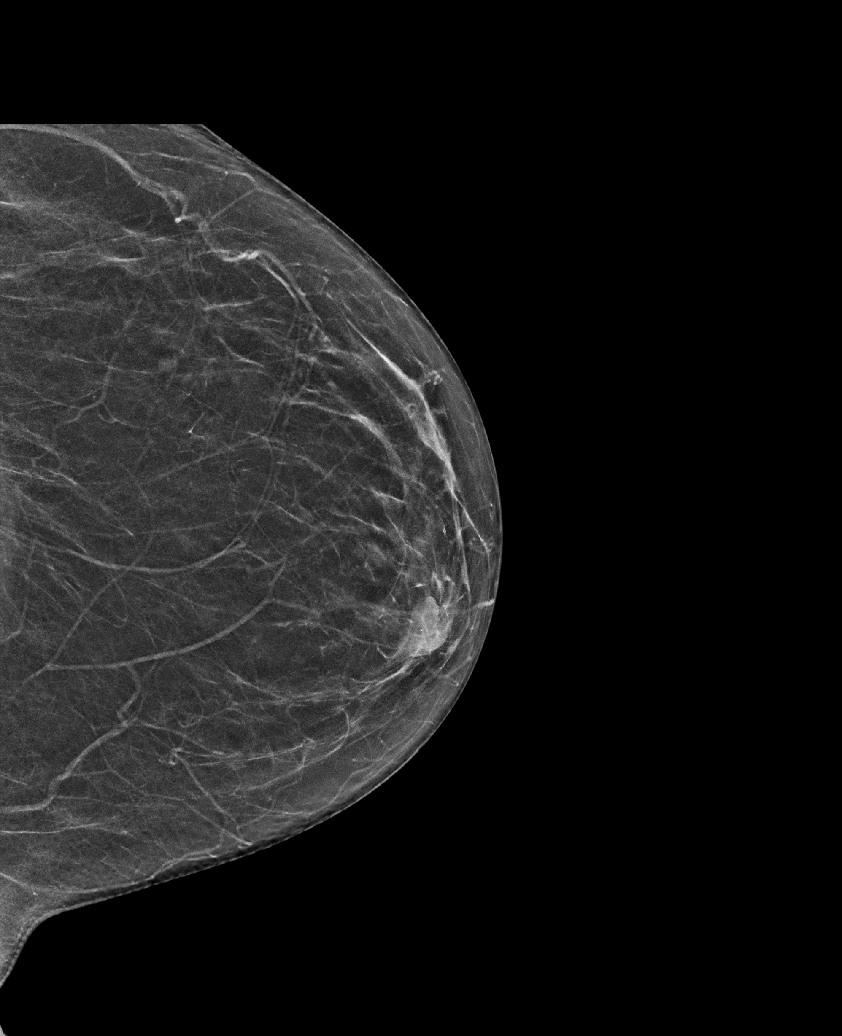

[R CC tomo · tomo slice 29/57.0]
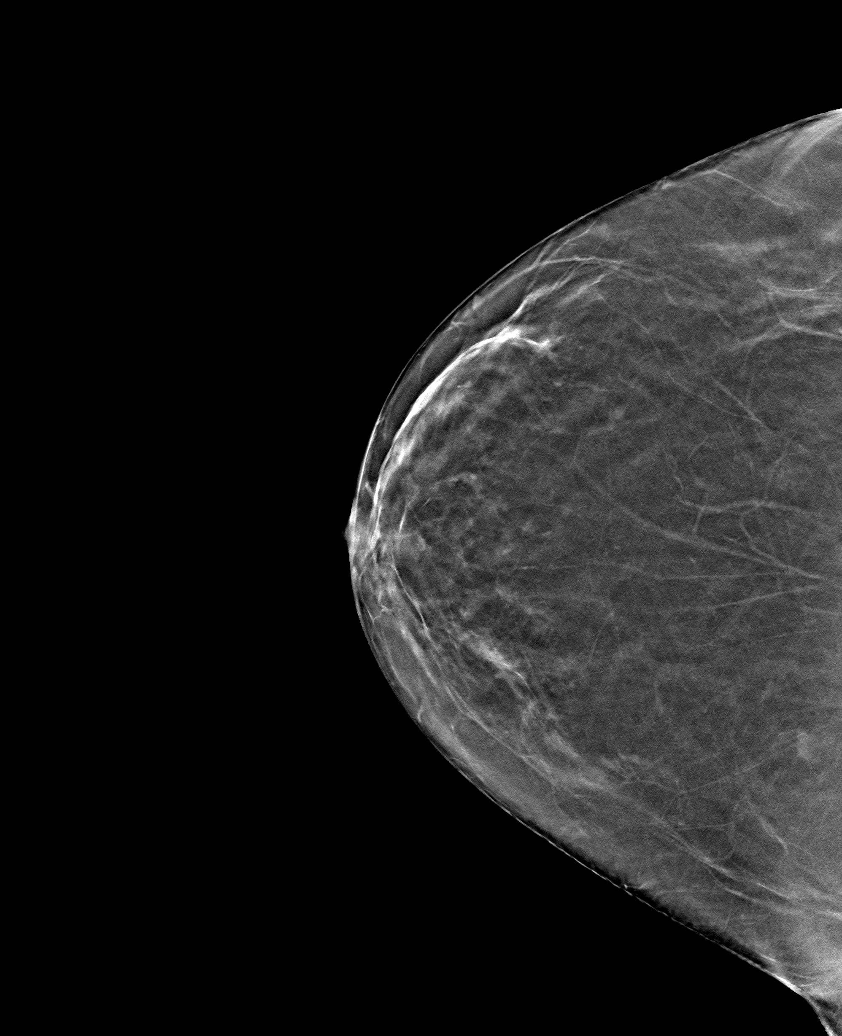

[6 of 30 positions shown; findings below may reference images not displayed]

ACR Breast Density Category b: There are scattered areas of
fibroglandular density.
FINDINGS: No suspicious masses or calcifications are seen in either breast.
Spot compression cc tomograms were performed over the area of
concern in the central/retroareolar left breast with no suspicious
abnormality seen.

Mammographic images were processed with CAD.

Physical examination reveals a red nodule the medial aspect of the
left nipple which contains a small yellow papule. There is an
additional palpable thickening in the skin adjacent to this nodule.
The patient states this area has demonstrated a small amount of
drainage. These areas are not particularly tender to palpation.
There is no surrounding redness in the skin of the breast.

Targeted ultrasound was performed. There is skin thickening with a
hypoechoic mass in the skin along the edge of the left nipple at the
9 o'clock position measuring 0.6 cm. Immediately adjacent to this
closer to the nipple there is an area of marked skin thickening
measuring up to 1.8 cm containing a small hypoechoic mass also
measuring 0.6 cm. A surgical scar is noted along the margin of the
skin thickening. Findings are most suggestive of inflamed [REDACTED]
glands.
IMPRESSION: 1. Findings are most suggestive of inflamed [REDACTED] glands
involving the medial portion of the areola of the left breast.

2.  No findings of malignancy in either breast.

RECOMMENDATION:
1. Clinical follow-up for inflamed [REDACTED] glands involving the
medial portion of the areola of the left breast. Consider a course
of antibiotics at the patient is exhibiting signs of infection. If
these small palpable [REDACTED] glands continue to be
bothersome/inflamed/infected, then recommend referral to a surgeon
for excision.

2.  Screening mammogram in one year.(Code:[id number]-[id number])

I have discussed the findings and recommendations with the patient.
If applicable, a reminder letter will be sent to the patient
regarding the next appointment.

BI-RADS CATEGORY  2: Benign.

## 2022-12-01 ENCOUNTER — Encounter: Payer: Self-pay | Admitting: Obstetrics & Gynecology

## 2022-12-01 ENCOUNTER — Ambulatory Visit (INDEPENDENT_AMBULATORY_CARE_PROVIDER_SITE_OTHER): Payer: Self-pay | Admitting: Obstetrics & Gynecology

## 2022-12-01 VITALS — BP 128/75 | HR 78 | Ht 66.0 in | Wt 130.2 lb

## 2022-12-01 DIAGNOSIS — Z113 Encounter for screening for infections with a predominantly sexual mode of transmission: Secondary | ICD-10-CM

## 2022-12-01 DIAGNOSIS — Z1231 Encounter for screening mammogram for malignant neoplasm of breast: Secondary | ICD-10-CM

## 2022-12-01 DIAGNOSIS — Z01411 Encounter for gynecological examination (general) (routine) with abnormal findings: Secondary | ICD-10-CM

## 2022-12-01 DIAGNOSIS — N959 Unspecified menopausal and perimenopausal disorder: Secondary | ICD-10-CM

## 2022-12-01 DIAGNOSIS — N39498 Other specified urinary incontinence: Secondary | ICD-10-CM

## 2022-12-01 MED ORDER — MIMVEY 1-0.5 MG PO TABS: 1.0000 | ORAL_TABLET | Freq: Every day | ORAL | 4 refills | Status: AC

## 2022-12-01 NOTE — Progress Notes (Signed)
WELL-WOMAN EXAMINATION Patient name: Heidi Williamson MRN [id number]  Date of birth: Mar 24, 1971 Chief Complaint:   Annual Exam (Urinary incontinence)  History of Present Illness:   Heidi Williamson is a 52 y.o. G0P0000  PM female being seen today for a routine well-woman exam.  Today she notes that she is struggling with her mood.  Notes vasomotor symptoms are currently a 10/10. Initially the prempro was working well, but seems like it is not as effective.  Pt also on celexa tried to change medication and noted worsening SI, she is now back on the Celexa and has follow up scheduled for March  For quite some time, she has also noted changes in her urination.  She feels like she cannot feel when she has to pee and will just leak.  Denies stress or urge incontinence.   Patient's last menstrual period was 08/08/2018.   Last pap 2023.  Last mammogram: 2022. Last colonoscopy: still has yet to complete     12/01/2022    2:01 PM 12/01/2021    3:38 PM 01/14/2021    2:31 PM  Depression screen PHQ 2/9  Decreased Interest 2 3 3   Down, Depressed, Hopeless 3 3 1   PHQ - 2 Score 5 6 4   Altered sleeping 3 3 3   Tired, decreased energy 3 3 3   Change in appetite 3 3 3   Feeling bad or failure about yourself  3 3 3   Trouble concentrating 1 3 1   Moving slowly or fidgety/restless 1 3 2   Suicidal thoughts 1 1 0  PHQ-9 Score 20 25 19       Review of Systems:   Pertinent items are noted in HPI Denies any headaches, blurred vision, fatigue, shortness of breath, chest pain, abdominal pain, bowel movements or intercourse unless otherwise stated above.  Pertinent History Reviewed:  Reviewed past medical,surgical, social and family history.  Reviewed problem list, medications and allergies. Physical Assessment:   Vitals:   12/01/22 1409  BP: 128/75  Pulse: 78  Weight: 130 lb 3.2 oz (59.1 kg)  Height: 5\' 6"  (1.676 m)  Body mass index is 21.01 kg/m.        Physical Examination:   General  appearance - well appearing, and in no distress  Mental status - alert, oriented to person, place, and time  Psych:  She has a normal mood and affect  Skin - warm and dry, normal color, no suspicious lesions noted  Chest - effort normal, bilateral wheezes  Heart - normal rate and regular rhythm  Neck:  midline trachea, no thyromegaly or nodules  Breasts - breasts appear normal, no suspicious masses, no skin or nipple changes or  axillary nodes  Abdomen - soft, nontender, nondistended, no masses or organomegaly  Pelvic - VULVA: normal appearing vulva with no masses, tenderness or lesions  VAGINA: normal appearing vagina with normal color and discharge, no lesions  CERVIX: normal appearing cervix without discharge or lesions, no CMT  UTERUS: uterus is felt to be normal size, shape, consistency and nontender   ADNEXA: No adnexal masses or tenderness noted.  Extremities:  No swelling or varicosities noted  Chaperone: Journalist, newspaper & Plan:  1) Well-Woman Exam -pap up to date, reviewed guidelines -mammogram ordered -desires STI screening  2) HRT -discussed pros/cons of medication and reviewed potential risk of HRT -plan to increase current dose, f/u in 3-69mos -also encouraged f/u with PCP regarding her depression medication  3) Urinary incontinence -encouraged regular emptying  of her bladder -UA and culture to r/o underlying infection  4) Tobacco use -discussed that some of her symptoms are likely related to her tobacco use -pt aware that she should quit -tried to cut back on EtOH and smoking and couldn't do both together.  Right now working on the alcohol  Orders Placed This Encounter  Procedures   Urine Culture   MM 3D SCREEN BREAST BILATERAL   RPR   HIV Antibody (routine testing w rflx)   Urinalysis, Routine w reflex microscopic    Meds:  Meds ordered this encounter  Medications   estradiol-norethindrone (MIMVEY) 1-0.5 MG tablet    Sig: Take 1 tablet  by mouth daily for 90 doses.    Dispense:  90 tablet    Refill:  4    Follow-up: Return in about 3 months (around 03/02/2023) for Medication follow up (ok for virtual).   Janyth Pupa, DO Attending [place], [facility] for [employer], [place]

## 2022-12-02 LAB — URINALYSIS, ROUTINE W REFLEX MICROSCOPIC
Bilirubin, UA: NEGATIVE
Glucose, UA: NEGATIVE
Ketones, UA: NEGATIVE
Leukocytes,UA: NEGATIVE
Nitrite, UA: NEGATIVE
Protein,UA: NEGATIVE
RBC, UA: NEGATIVE
Specific Gravity, UA: 1.007 (ref 1.005–1.030)
Urobilinogen, Ur: 0.2 mg/dL (ref 0.2–1.0)
pH, UA: 6 (ref 5.0–7.5)

## 2022-12-02 LAB — RPR: RPR Ser Ql: NONREACTIVE

## 2022-12-02 LAB — HIV ANTIBODY (ROUTINE TESTING W REFLEX): HIV Screen 4th Generation wRfx: NONREACTIVE

## 2022-12-03 LAB — CERVICOVAGINAL ANCILLARY ONLY
Chlamydia: NEGATIVE
Comment: NEGATIVE
Comment: NEGATIVE
Comment: NORMAL
Neisseria Gonorrhea: NEGATIVE
Trichomonas: NEGATIVE

## 2022-12-03 LAB — URINE CULTURE

## 2022-12-07 ENCOUNTER — Telehealth: Payer: Self-pay | Admitting: *Deleted

## 2022-12-07 NOTE — Telephone Encounter (Signed)
Patient informed std screening negative along with urine result.  Pt verbalized understanding with no further questions.

## 2023-03-07 ENCOUNTER — Ambulatory Visit: Payer: BLUE CROSS/BLUE SHIELD | Admitting: Obstetrics & Gynecology

## 2024-01-23 ENCOUNTER — Ambulatory Visit: Payer: BLUE CROSS/BLUE SHIELD | Admitting: Obstetrics & Gynecology
